# Patient Record
Sex: Male | Born: 1950 | Race: White | Hispanic: No | Marital: Married | Smoking: Current every day smoker
Health system: Southern US, Community
[De-identification: ages and names within clinical notes are randomized; demographics above are authoritative.]

## PROBLEM LIST (undated history)

## (undated) DIAGNOSIS — M1990 Unspecified osteoarthritis, unspecified site: Secondary | ICD-10-CM

## (undated) DIAGNOSIS — Z973 Presence of spectacles and contact lenses: Secondary | ICD-10-CM

## (undated) DIAGNOSIS — I2510 Atherosclerotic heart disease of native coronary artery without angina pectoris: Secondary | ICD-10-CM

## (undated) DIAGNOSIS — I10 Essential (primary) hypertension: Secondary | ICD-10-CM

## (undated) DIAGNOSIS — K219 Gastro-esophageal reflux disease without esophagitis: Secondary | ICD-10-CM

## (undated) DIAGNOSIS — F419 Anxiety disorder, unspecified: Secondary | ICD-10-CM

## (undated) DIAGNOSIS — D72829 Elevated white blood cell count, unspecified: Secondary | ICD-10-CM

## (undated) DIAGNOSIS — N2889 Other specified disorders of kidney and ureter: Secondary | ICD-10-CM

## (undated) DIAGNOSIS — J449 Chronic obstructive pulmonary disease, unspecified: Secondary | ICD-10-CM

## (undated) HISTORY — PX: HERNIA REPAIR: SHX51

## (undated) HISTORY — PX: TONSILLECTOMY: SUR1361

## (undated) HISTORY — PX: WISDOM TOOTH EXTRACTION: SHX21

---

## 1973-03-25 HISTORY — PX: FRACTURE SURGERY: SHX138

## 2004-08-16 ENCOUNTER — Encounter: Admission: RE | Admit: 2004-08-16 | Discharge: 2004-11-14 | Payer: Self-pay | Admitting: Family Medicine

## 2006-11-03 ENCOUNTER — Ambulatory Visit: Payer: Self-pay | Admitting: Hematology and Oncology

## 2006-11-07 LAB — CBC WITH DIFFERENTIAL/PLATELET
Basophils Absolute: 0.1 10*3/uL (ref 0.0–0.1)
EOS%: 1.6 % (ref 0.0–7.0)
HGB: 16.9 g/dL (ref 13.0–17.1)
LYMPH%: 21.8 % (ref 14.0–48.0)
MCH: 33.5 pg — ABNORMAL HIGH (ref 28.0–33.4)
NEUT#: 7.4 10*3/uL — ABNORMAL HIGH (ref 1.5–6.5)
NEUT%: 71.5 % (ref 40.0–75.0)
RBC: 5.04 10*6/uL (ref 4.20–5.71)
RDW: 12.8 % (ref 11.2–14.6)
WBC: 10.3 10*3/uL — ABNORMAL HIGH (ref 4.0–10.0)
lymph#: 2.3 10*3/uL (ref 0.9–3.3)

## 2006-11-11 ENCOUNTER — Ambulatory Visit (HOSPITAL_COMMUNITY): Admission: RE | Admit: 2006-11-11 | Discharge: 2006-11-11 | Payer: Self-pay | Admitting: Hematology and Oncology

## 2006-11-14 LAB — COMPREHENSIVE METABOLIC PANEL
ALT: 19 U/L (ref 0–53)
AST: 18 U/L (ref 0–37)
BUN: 15 mg/dL (ref 6–23)
Calcium: 9.3 mg/dL (ref 8.4–10.5)
Chloride: 104 mEq/L (ref 96–112)
Creatinine, Ser: 1.06 mg/dL (ref 0.40–1.50)
Potassium: 3.9 mEq/L (ref 3.5–5.3)
Sodium: 139 mEq/L (ref 135–145)
Total Bilirubin: 0.4 mg/dL (ref 0.3–1.2)
Total Protein: 6.3 g/dL (ref 6.0–8.3)

## 2006-11-14 LAB — LACTATE DEHYDROGENASE: LDH: 130 U/L (ref 94–250)

## 2009-04-03 ENCOUNTER — Encounter: Admission: RE | Admit: 2009-04-03 | Discharge: 2009-04-03 | Payer: Self-pay | Admitting: Family Medicine

## 2012-11-24 ENCOUNTER — Other Ambulatory Visit: Payer: Self-pay | Admitting: Family Medicine

## 2012-11-24 DIAGNOSIS — R109 Unspecified abdominal pain: Secondary | ICD-10-CM

## 2012-11-25 ENCOUNTER — Ambulatory Visit
Admission: RE | Admit: 2012-11-25 | Discharge: 2012-11-25 | Disposition: A | Discharge status: Home / Self Care | Payer: Federal, State, Local not specified - PPO | Source: Ambulatory Visit | Attending: Family Medicine | Admitting: Family Medicine

## 2012-11-25 DIAGNOSIS — R109 Unspecified abdominal pain: Secondary | ICD-10-CM

## 2014-06-14 ENCOUNTER — Telehealth: Payer: Self-pay | Admitting: Internal Medicine

## 2014-06-14 NOTE — Telephone Encounter (Signed)
CALLED PT ABOUT REFERRING TO [facility] DUE TO AVAILABILITY HERE IN OUR OFFICE.  INFORMED PATIENT THAT I WOULD BE CALLING [facility] AND SOMEONE WOULD CALL BACK WITH AN APPOINTMENT DATE AND TIME.

## 2014-06-15 ENCOUNTER — Encounter: Payer: Self-pay | Admitting: Hematology & Oncology

## 2014-06-22 ENCOUNTER — Telehealth: Payer: Self-pay | Admitting: Hematology & Oncology

## 2014-06-22 NOTE — Telephone Encounter (Signed)
I spoke w NEW PATIENT wife Mechele Claude) today to remind them of their appointment with Dr. Marin Olp. Also, advised them to bring all medication bottles and insurance card information.

## 2014-06-23 ENCOUNTER — Ambulatory Visit: Payer: Federal, State, Local not specified - PPO

## 2014-06-23 ENCOUNTER — Encounter: Payer: Self-pay | Admitting: Family

## 2014-06-23 ENCOUNTER — Other Ambulatory Visit: Payer: Self-pay | Admitting: Family

## 2014-06-23 ENCOUNTER — Ambulatory Visit (HOSPITAL_BASED_OUTPATIENT_CLINIC_OR_DEPARTMENT_OTHER): Payer: Federal, State, Local not specified - PPO | Admitting: Family

## 2014-06-23 ENCOUNTER — Other Ambulatory Visit (HOSPITAL_BASED_OUTPATIENT_CLINIC_OR_DEPARTMENT_OTHER): Payer: Federal, State, Local not specified - PPO

## 2014-06-23 VITALS — BP 115/74 | HR 74 | Temp 97.8°F | Resp 18 | Ht 74.0 in | Wt 226.0 lb

## 2014-06-23 DIAGNOSIS — Z720 Tobacco use: Secondary | ICD-10-CM

## 2014-06-23 DIAGNOSIS — D72829 Elevated white blood cell count, unspecified: Secondary | ICD-10-CM

## 2014-06-23 LAB — CBC WITH DIFFERENTIAL (CANCER CENTER ONLY)
BASO#: 0 10*3/uL (ref 0.0–0.2)
BASO%: 0.3 % (ref 0.0–2.0)
EOS%: 0.8 % (ref 0.0–7.0)
Eosinophils Absolute: 0.1 10*3/uL (ref 0.0–0.5)
HCT: 44.8 % (ref 38.7–49.9)
HGB: 15.2 g/dL (ref 13.0–17.1)
LYMPH#: 2 10*3/uL (ref 0.9–3.3)
LYMPH%: 13.2 % — AB (ref 14.0–48.0)
MCH: 30.6 pg (ref 28.0–33.4)
MCHC: 33.9 g/dL (ref 32.0–35.9)
MCV: 90 fL (ref 82–98)
MONO#: 0.9 10*3/uL (ref 0.1–0.9)
MONO%: 6 % (ref 0.0–13.0)
NEUT%: 79.7 % (ref 40.0–80.0)
NEUTROS ABS: 11.9 10*3/uL — AB (ref 1.5–6.5)
PLATELETS: 369 10*3/uL (ref 145–400)
RBC: 4.97 10*6/uL (ref 4.20–5.70)
RDW: 14.3 % (ref 11.1–15.7)
WBC: 14.9 10*3/uL — ABNORMAL HIGH (ref 4.0–10.0)

## 2014-06-23 LAB — CHCC SATELLITE - SMEAR

## 2014-06-23 NOTE — Progress Notes (Signed)
Hematology/Oncology Consultation   Name: Zachary Oneal      MRN: [id number]    Location: Room/bed info not found  Date: 06/23/2014 Time:1:49 PM   REFERRING PHYSICIAN: Melinda Crutch, MD  REASON FOR CONSULT: Abnormal CBC   DIAGNOSIS: Leukocytosis  HISTORY OF PRESENT ILLNESS: Zachary Oneal is a very pleasant 64 yo white male with a history of leukopenia. He was seen in 2008 by Zachary Oneal for this same issue. His Jak-2 Oneal was negative. His Zachary Oneal was also negative at that time.  His WBC count today is 14.9. He has had no issue with infections. He has smoked 1 ppd for the last 32 years. He is interested in quitting. We gave him a smoking cessation hand out and also a Zachary Oneal number.  He denies fatigue, fever, chills, n/v, cough, rash, dizziness, blurred vision, headache, chest pain, palpitations, abdominal pain, constipation, diarrhea, blood in urine or stool. He does get SOB at times with exertion.  He has no personal or familial history of anemia, bleeding or clotting disorders.  His father passed away from pancreatic cancer. His mother passed away from metastatic cancer with unknown primary.  He has 2 healthy daughters.  He is originally from [place] but moved to [place] for work 15 years ago. 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 He has experienced some stress with this new job but he says that it is getting better.  No weakness, swelling, tenderness, numbness or tingling in his extremities. No new aches or pains.  His appetite is good and he is staying hydrated. No recent weight loss or gain.   ROS: All other 10 point review of systems is negative.   PAST MEDICAL HISTORY:   No past medical history on file.  ALLERGIES: Allergies not on file    MEDICATIONS:  No current outpatient prescriptions on file prior to visit.   No current facility-administered medications on file prior to visit.     PAST SURGICAL HISTORY No past surgical history on file.  FAMILY  HISTORY: No family history on file.  SOCIAL HISTORY:  has no tobacco, alcohol, and drug history on file.  PERFORMANCE STATUS: The patient's performance status is 0 - Asymptomatic  PHYSICAL EXAM: Most Recent Vital Signs: There were no vitals taken for this visit. BP 115/74 mmHg  Pulse 74  Temp(Src) 97.8 F (36.6 C) (Oral)  Resp 18  Ht 6' 2"  (1.88 m)  Wt 226 lb (102.513 kg)  BMI 29.00 kg/m2  General Appearance:    Alert, cooperative, no distress, appears stated age  Head:    Normocephalic, without obvious abnormality, atraumatic  Eyes:    PERRL, conjunctiva/corneas clear, EOM's intact, fundi    benign, both eyes             Throat:   Lips, mucosa, and tongue normal; teeth and gums normal  Neck:   Supple, symmetrical, trachea midline, no adenopathy;       thyroid:  No enlargement/tenderness/nodules; no carotid   bruit or JVD  Back:     Symmetric, no curvature, ROM normal, no CVA tenderness  Lungs:     Clear to auscultation bilaterally, respirations unlabored  Chest wall:    No tenderness or deformity  Heart:    Regular rate and rhythm, S1 and S2 normal, no murmur, rub   or gallop  Abdomen:     Soft, non-tender, bowel sounds active all four quadrants,    no masses, no organomegaly  Extremities:   Extremities normal, atraumatic, no cyanosis or edema  Pulses:   2+ and symmetric all extremities  Skin:   Skin color, texture, turgor normal, no rashes or lesions  Lymph nodes:   Cervical, supraclavicular, and axillary nodes normal  Neurologic:   CNII-XII intact. Normal strength, sensation and reflexes      throughout   LABORATORY DATA:  No results found for this or any previous visit (from the past 48 hour(s)).    RADIOGRAPHY: No results found.     PATHOLOGY: None  ASSESSMENT/PLAN: Zachary Oneal is a very pleasant 64 yo white male with a history of leukopenia. He is asymptomatic with this. He has had no problem with infections.  He has smoked 1 ppd for the last 32  years. We discussed the fact that a long history of smoking can cause an elevated WBC count. He does want to quit. We gave him a smoking cessation packet.   His WBC count today is 14.9. Zachary Oneal viewed his blood and smear and saw no evidence of malignancy.  We do not need to see him back in the office. We gave him information for getting set up on Zachary Oneal.  All questions were answered. The patient knows to contact us with any problems, questions or concerns. We can certainly see the him for any future hematologic issues.   The patient was discussed with and also seen by Zachary Oneal and he is in agreement with the aforementioned.   [facility] [employer] M    Addendum:    I saw and examined the patient with Zachary Oneal. I looked at his blood smear. He had good maturation of his white blood cells. He had no immature myeloid cells. There were no atypical lymphocytes. I saw no blasts. His red cells and platelets appeared normal.  His white blood cell count has been somewhat elevated for the past 8 years. He  HAS ALREADY underwent an   evaluation with Zachary Oneal which was negative and the Zachary Oneal which was also negative.   There is nothing on his physical exam that would suggest  A myeloproliferative process.   I just don't think we have to get him back. Again he is smoking. It is possible at the back and use might be causing his blood count to be a little on the high side.   He was  fun talking to him.  Zachary Oneal

## 2014-06-23 NOTE — Patient Instructions (Signed)
Smoking Cessation Quitting smoking is important to your health and has many advantages. However, it is not always easy to quit since nicotine is a very addictive drug. Oftentimes, people try 3 times or more before being able to quit. This document explains the best ways for you to prepare to quit smoking. Quitting takes hard work and a lot of effort, but you can do it. ADVANTAGES OF QUITTING SMOKING  You will live longer, feel better, and live better.  Your body will feel the impact of quitting smoking almost immediately.  Within 20 minutes, blood pressure decreases. Your pulse returns to its normal level.  After 8 hours, carbon monoxide levels in the blood return to normal. Your oxygen level increases.  After 24 hours, the chance of having a heart attack starts to decrease. Your breath, hair, and body stop smelling like smoke.  After 48 hours, damaged nerve endings begin to recover. Your sense of taste and smell improve.  After 72 hours, the body is virtually free of nicotine. Your bronchial tubes relax and breathing becomes easier.  After 2 to 12 weeks, lungs can hold more air. Exercise becomes easier and circulation improves.  The risk of having a heart attack, stroke, cancer, or lung disease is greatly reduced.  After 1 year, the risk of coronary heart disease is cut in half.  After 5 years, the risk of stroke falls to the same as a nonsmoker.  After 10 years, the risk of lung cancer is cut in half and the risk of other cancers decreases significantly.  After 15 years, the risk of coronary heart disease drops, usually to the level of a nonsmoker.  If you are pregnant, quitting smoking will improve your chances of having a healthy baby.  The people you live with, especially any children, will be healthier.  You will have extra money to spend on things other than cigarettes. QUESTIONS TO THINK ABOUT BEFORE ATTEMPTING TO QUIT You may want to talk about your answers with your  health care provider.  Why do you want to quit?  If you tried to quit in the past, what helped and what did not?  What will be the most difficult situations for you after you quit? How will you plan to handle them?  Who can help you through the tough times? Your family? Friends? A health care provider?  What pleasures do you get from smoking? What ways can you still get pleasure if you quit? Here are some questions to ask your health care provider:  How can you help me to be successful at quitting?  What medicine do you think would be best for me and how should I take it?  What should I do if I need more help?  What is smoking withdrawal like? How can I get information on withdrawal? GET READY  Set a quit date.  Change your environment by getting rid of all cigarettes, ashtrays, matches, and lighters in your home, car, or work. Do not let people smoke in your home.  Review your past attempts to quit. Think about what worked and what did not. GET SUPPORT AND ENCOURAGEMENT You have a better chance of being successful if you have help. You can get support in many ways.  Tell your family, friends, and coworkers that you are going to quit and need their support. Ask them not to smoke around you.  Get individual, group, or telephone counseling and support. Programs are available at local hospitals and health centers. Call   your local health department for information about programs in your area.  Spiritual beliefs and practices may help some smokers quit.  Download a "quit meter" on your computer to keep track of quit statistics, such as how long you have gone without smoking, cigarettes not smoked, and money saved.  Get a self-help book about quitting smoking and staying off tobacco. LEARN NEW SKILLS AND BEHAVIORS  Distract yourself from urges to smoke. Talk to someone, go for a walk, or occupy your time with a task.  Change your normal routine. Take a different route to work.  Drink tea instead of coffee. Eat breakfast in a different place.  Reduce your stress. Take a hot bath, exercise, or read a book.  Plan something enjoyable to do every day. Reward yourself for not smoking.  Explore interactive web-based programs that specialize in helping you quit. GET MEDICINE AND USE IT CORRECTLY Medicines can help you stop smoking and decrease the urge to smoke. Combining medicine with the above behavioral methods and support can greatly increase your chances of successfully quitting smoking.  Nicotine replacement therapy helps deliver nicotine to your body without the negative effects and risks of smoking. Nicotine replacement therapy includes nicotine gum, lozenges, inhalers, nasal sprays, and skin patches. Some may be available over-the-counter and others require a prescription.  Antidepressant medicine helps people abstain from smoking, but how this works is unknown. This medicine is available by prescription.  Nicotinic receptor partial agonist medicine simulates the effect of nicotine in your brain. This medicine is available by prescription. Ask your health care provider for advice about which medicines to use and how to use them based on your health history. Your health care provider will tell you what side effects to look out for if you choose to be on a medicine or therapy. Carefully read the information on the package. Do not use any other product containing nicotine while using a nicotine replacement product.  RELAPSE OR DIFFICULT SITUATIONS Most relapses occur within the first 3 months after quitting. Do not be discouraged if you start smoking again. Remember, most people try several times before finally quitting. You may have symptoms of withdrawal because your body is used to nicotine. You may crave cigarettes, be irritable, feel very hungry, cough often, get headaches, or have difficulty concentrating. The withdrawal symptoms are only temporary. They are strongest  when you first quit, but they will go away within 10-14 days. To reduce the chances of relapse, try to:  Avoid drinking alcohol. Drinking lowers your chances of successfully quitting.  Reduce the amount of caffeine you consume. Once you quit smoking, the amount of caffeine in your body increases and can give you symptoms, such as a rapid heartbeat, sweating, and anxiety.  Avoid smokers because they can make you want to smoke.  Do not let weight gain distract you. Many smokers will gain weight when they quit, usually less than 10 pounds. Eat a healthy diet and stay active. You can always lose the weight gained after you quit.  Find ways to improve your mood other than smoking. FOR MORE INFORMATION  www.smokefree.gov  Document Released: 03/05/2001 Document Revised: 07/26/2013 Document Reviewed: 06/20/2011 Exit[employer] Patient Information 2015 [employer]. This information is not intended to replace advice given to you by your health care provider. Make sure you discuss any questions you have with your health care provider.  

## 2015-04-25 DIAGNOSIS — K30 Functional dyspepsia: Secondary | ICD-10-CM | POA: Insufficient documentation

## 2015-04-26 ENCOUNTER — Other Ambulatory Visit: Payer: Self-pay | Admitting: Family Medicine

## 2015-04-26 DIAGNOSIS — R1013 Epigastric pain: Secondary | ICD-10-CM

## 2015-04-26 DIAGNOSIS — R1012 Left upper quadrant pain: Secondary | ICD-10-CM

## 2015-05-05 ENCOUNTER — Ambulatory Visit
Admission: RE | Admit: 2015-05-05 | Discharge: 2015-05-05 | Disposition: A | Discharge status: Home / Self Care | Payer: Federal, State, Local not specified - PPO | Source: Ambulatory Visit | Attending: Family Medicine | Admitting: Family Medicine

## 2015-05-05 DIAGNOSIS — R1012 Left upper quadrant pain: Secondary | ICD-10-CM

## 2015-05-05 DIAGNOSIS — R1013 Epigastric pain: Secondary | ICD-10-CM

## 2015-05-22 ENCOUNTER — Other Ambulatory Visit: Payer: Self-pay | Admitting: Gastroenterology

## 2015-05-22 DIAGNOSIS — R1012 Left upper quadrant pain: Secondary | ICD-10-CM

## 2015-05-25 ENCOUNTER — Ambulatory Visit: Payer: Federal, State, Local not specified - PPO

## 2015-05-25 ENCOUNTER — Ambulatory Visit (HOSPITAL_BASED_OUTPATIENT_CLINIC_OR_DEPARTMENT_OTHER): Payer: Federal, State, Local not specified - PPO | Admitting: Hematology & Oncology

## 2015-05-25 ENCOUNTER — Other Ambulatory Visit (HOSPITAL_BASED_OUTPATIENT_CLINIC_OR_DEPARTMENT_OTHER): Payer: Federal, State, Local not specified - PPO

## 2015-05-25 ENCOUNTER — Encounter: Payer: Self-pay | Admitting: Hematology & Oncology

## 2015-05-25 VITALS — BP 123/63 | HR 70 | Temp 97.9°F | Resp 18 | Ht 74.0 in | Wt 219.0 lb

## 2015-05-25 DIAGNOSIS — D72829 Elevated white blood cell count, unspecified: Secondary | ICD-10-CM | POA: Diagnosis not present

## 2015-05-25 DIAGNOSIS — Z720 Tobacco use: Secondary | ICD-10-CM

## 2015-05-25 LAB — CBC WITH DIFFERENTIAL (CANCER CENTER ONLY)
BASO#: 0 10*3/uL (ref 0.0–0.2)
BASO%: 0.2 % (ref 0.0–2.0)
EOS ABS: 0.2 10*3/uL (ref 0.0–0.5)
EOS%: 1.2 % (ref 0.0–7.0)
HCT: 45.7 % (ref 38.7–49.9)
HEMOGLOBIN: 15.8 g/dL (ref 13.0–17.1)
LYMPH#: 2.4 10*3/uL (ref 0.9–3.3)
LYMPH%: 13 % — AB (ref 14.0–48.0)
MCH: 31.2 pg (ref 28.0–33.4)
MCHC: 34.6 g/dL (ref 32.0–35.9)
MCV: 90 fL (ref 82–98)
MONO#: 1.2 10*3/uL — ABNORMAL HIGH (ref 0.1–0.9)
MONO%: 6.6 % (ref 0.0–13.0)
NEUT%: 79 % (ref 40.0–80.0)
NEUTROS ABS: 14.3 10*3/uL — AB (ref 1.5–6.5)
Platelets: 430 10*3/uL — ABNORMAL HIGH (ref 145–400)
RBC: 5.07 10*6/uL (ref 4.20–5.70)
RDW: 15.1 % (ref 11.1–15.7)
WBC: 18.1 10*3/uL — ABNORMAL HIGH (ref 4.0–10.0)

## 2015-05-25 LAB — CHCC SATELLITE - SMEAR

## 2015-05-25 NOTE — Progress Notes (Signed)
Hematology and Oncology Follow Up Visit  Ferron Ishmael [id number] 1950/04/30 65 y.o. 05/25/2015   Principle Diagnosis:   Chronic leukocytosis  Current Therapy:    Observation     Interim History:  Mr. Hollibaugh is back for follow-up. We saw year ago. We saw him a year ago, we do not think that there was going to be a problem with the leukocytosis. It is been chronic.  His primary care doctor still is concerned about the leukocytosis. As such, he wanted Mr. Whack to be seen again.   He is now retired. 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 He did not like doing this. As such, he is just doing work around the house.  He is still smoking. He's trying to stop smoking. He is cutting back.  He's had no problem with infections. He's had no rashes. He's had a change in bowel or bladder habits.  He is having some GI issues. He has been seen by gastroenterology. He says he is due for a CT scan next week. He did have a ultrasound done about a month ago. The ultrasound of the abdomen did not show any splenomegaly. His liver was fine. Kidneys looked okay. There is no lymphadenopathy.  He's had no weight loss or weight gain. He is exercising more. He is trying to stay more "fit".  Overall, his performance status is ECOG 0.  Medications:  Current outpatient prescriptions:  .  aspirin 81 MG tablet, Take 81 mg by mouth daily., Disp: , Rfl:  .  benazepril-hydrochlorthiazide (LOTENSIN HCT) 20-25 MG per tablet, Take 1 tablet by mouth daily., Disp: , Rfl: 4 .  citalopram (CELEXA) 20 MG tablet, Take 20 mg by mouth daily., Disp: , Rfl: 1 .  famotidine-calcium carbonate-magnesium hydroxide (PEPCID COMPLETE) 10-800-165 MG chewable tablet, , Disp: , Rfl:  .  fenofibrate micronized (LOFIBRA) 134 MG capsule, Take 134 mg by mouth daily before breakfast. , Disp: , Rfl: 4 .  LORazepam (ATIVAN) 0.5 MG tablet, 1/2-1 tablet as needed for nerves, Disp: , Rfl:  .   Multiple Vitamins-Minerals (MULTIVITAMIN WITH MINERALS) tablet, Take 2 tablets by mouth daily., Disp: , Rfl:  .  Probiotic Product (PROBIOTIC & ACIDOPHILUS EX ST PO), , Disp: , Rfl:   Allergies: No Known Allergies  Past Medical History, Surgical history, Social history, and Family History were reviewed and updated.  Review of Systems: As above  Physical Exam:  height is 6' 2"  (1.88 m) and weight is 219 lb (99.338 kg). His oral temperature is 97.9 F (36.6 C). His blood pressure is 123/63 and his pulse is 70. His respiration is 18.   Wt Readings from Last 3 Encounters:  05/25/15 219 lb (99.338 kg)  06/23/14 226 lb (102.513 kg)     Well-developed and well-nourished white male in no obvious distress. Head and neck exam shows no ocular or oral lesions. There are no palpable cervical or supraclavicular lymph nodes. Lungs are clear. Cardiac exam regular rate and rhythm with no murmurs, rubs or bruits. Abdomen is soft. He has good bowel sounds. There is no fluid wave. There is no palpable liver or spleen tip. Back exam shows no tenderness over the spine, ribs or hips. Extremities shows no clubbing, cyanosis or edema. Neurological exam shows no focal neurological deficits. Skin exam shows no rashes, ecchymoses or petechia.  Lab Results  Component Value Date   WBC 18.1* 05/25/2015   HGB 15.8 05/25/2015  HCT 45.7 05/25/2015   MCV 90 05/25/2015   PLT 430* 05/25/2015     Chemistry      Component Value Date/Time   NA 139 11/07/2006 1323   K 3.9 11/07/2006 1323   CL 104 11/07/2006 1323   CO2 24 11/07/2006 1323   BUN 15 11/07/2006 1323   CREATININE 1.06 11/07/2006 1323      Component Value Date/Time   CALCIUM 9.3 11/07/2006 1323   ALKPHOS 69 11/07/2006 1323   AST 18 11/07/2006 1323   ALT 19 11/07/2006 1323   BILITOT 0.4 11/07/2006 1323         Impression and Plan: Mr. Bristol is a 65 year old male. He has chronic leukocytosis. We did look at his blood smear. He has mature white  cells. I do not see any immature myeloid cells. There are no atypical lymphocytes. Red cells show no new collated red cells. He has no rouleau formation. Platelets are adequate in number and size.  I still have to believe that the leukocytosis is reactive. I don't think he has a underlying myeloproliferative process.  As such, I don't think we have to do any bone marrow biopsies on him right now.  I think we do have to follow him along. I do that we have to get him back in 6 months. I think if all looks good 6 months, then we can probably get him back yearly.  It was nice to see him again. We will try not to bother him too much as he is very busy.  Volanda Napoleon, MD 3/2/20175:15 PM

## 2015-05-29 ENCOUNTER — Ambulatory Visit
Admission: RE | Admit: 2015-05-29 | Discharge: 2015-05-29 | Disposition: A | Discharge status: Home / Self Care | Payer: Federal, State, Local not specified - PPO | Source: Ambulatory Visit | Attending: Gastroenterology | Admitting: Gastroenterology

## 2015-05-29 DIAGNOSIS — R1012 Left upper quadrant pain: Secondary | ICD-10-CM

## 2015-05-29 MED ORDER — IOPAMIDOL (ISOVUE-300) INJECTION 61%
125.0000 mL | Freq: Once | INTRAVENOUS | Status: AC | PRN
  Administered 2015-05-29: 125 mL via INTRAVENOUS

## 2015-11-16 ENCOUNTER — Other Ambulatory Visit: Payer: Self-pay | Admitting: Gastroenterology

## 2015-11-16 DIAGNOSIS — K529 Noninfective gastroenteritis and colitis, unspecified: Secondary | ICD-10-CM

## 2015-11-29 ENCOUNTER — Ambulatory Visit
Admission: RE | Admit: 2015-11-29 | Discharge: 2015-11-29 | Disposition: A | Discharge status: Home / Self Care | Payer: Federal, State, Local not specified - PPO | Source: Ambulatory Visit | Attending: Gastroenterology | Admitting: Gastroenterology

## 2015-11-29 DIAGNOSIS — K529 Noninfective gastroenteritis and colitis, unspecified: Secondary | ICD-10-CM

## 2015-11-29 MED ORDER — IOPAMIDOL (ISOVUE-300) INJECTION 61%
125.0000 mL | Freq: Once | INTRAVENOUS | Status: AC | PRN
  Administered 2015-11-29: 125 mL via INTRAVENOUS

## 2015-11-30 ENCOUNTER — Other Ambulatory Visit (HOSPITAL_BASED_OUTPATIENT_CLINIC_OR_DEPARTMENT_OTHER): Payer: Federal, State, Local not specified - PPO

## 2015-11-30 ENCOUNTER — Encounter: Payer: Self-pay | Admitting: Hematology & Oncology

## 2015-11-30 ENCOUNTER — Ambulatory Visit (HOSPITAL_BASED_OUTPATIENT_CLINIC_OR_DEPARTMENT_OTHER): Payer: Federal, State, Local not specified - PPO | Admitting: Hematology & Oncology

## 2015-11-30 VITALS — BP 135/62 | HR 93 | Temp 97.3°F | Resp 18 | Ht 74.0 in | Wt 213.8 lb

## 2015-11-30 DIAGNOSIS — D72829 Elevated white blood cell count, unspecified: Secondary | ICD-10-CM

## 2015-11-30 LAB — CBC WITH DIFFERENTIAL (CANCER CENTER ONLY)
BASO#: 0 10*3/uL (ref 0.0–0.2)
BASO%: 0.3 % (ref 0.0–2.0)
EOS ABS: 0.3 10*3/uL (ref 0.0–0.5)
EOS%: 2 % (ref 0.0–7.0)
HCT: 45.4 % (ref 38.7–49.9)
HGB: 15.5 g/dL (ref 13.0–17.1)
LYMPH#: 2.1 10*3/uL (ref 0.9–3.3)
LYMPH%: 14.1 % (ref 14.0–48.0)
MCH: 31.7 pg (ref 28.0–33.4)
MCHC: 34.1 g/dL (ref 32.0–35.9)
MCV: 93 fL (ref 82–98)
MONO#: 1 10*3/uL — AB (ref 0.1–0.9)
MONO%: 7 % (ref 0.0–13.0)
NEUT#: 11.3 10*3/uL — ABNORMAL HIGH (ref 1.5–6.5)
NEUT%: 76.6 % (ref 40.0–80.0)
PLATELETS: 406 10*3/uL — AB (ref 145–400)
RBC: 4.89 10*6/uL (ref 4.20–5.70)
RDW: 14.7 % (ref 11.1–15.7)
WBC: 14.8 10*3/uL — ABNORMAL HIGH (ref 4.0–10.0)

## 2015-11-30 LAB — CHCC SATELLITE - SMEAR

## 2015-11-30 NOTE — Progress Notes (Signed)
Hematology and Oncology Follow Up Visit  Fitzhugh Vizcarrondo [id number] 1950-06-17 65 y.o. 11/30/2015   Principle Diagnosis:   Chronic leukocytosis  Current Therapy:    Observation     Interim History:  [name] is back for follow-up. He is doing okay. He is has been having some abdominal pain. This is over on the right side. He did have a CT of the abdomen yesterday. This is ordered by his gastroenterologist. Of course, the radiologist thought there might be an issue with the left ureteropelvic junction. Because this, I'm sure that he will have to go to urology. There was no obvious malignancy that was noted however, the urologist thought the change on left side was "worrisome" for transitional cell carcinoma.  Also noted was some probable thickening with adenopathy in the distal/terminal ileum. Again, she with this means. He may need to have a colonoscopy to look at this area.  He's not having any pain on the left side. He's having no problems urinating. There is no hematuria. There is no fever. He's had no dysuria.  His appetite has been doing well. He's had no nausea or vomiting.'s had a change in bowel or bladder habits.   He's had no weight loss or weight gain. He is exercising more. He is trying to stay more "fit".  Overall, his performance status is ECOG 0.  Medications:  Current Outpatient Prescriptions:  .  aspirin 81 MG tablet, Take 81 mg by mouth daily., Disp: , Rfl:  .  benazepril-hydrochlorthiazide (LOTENSIN HCT) 20-25 MG per tablet, Take 1 tablet by mouth daily., Disp: , Rfl: 4 .  citalopram (CELEXA) 20 MG tablet, Take 20 mg by mouth daily., Disp: , Rfl: 1 .  cyclobenzaprine (FLEXERIL) 10 MG tablet, 1/2-1 tablet, Disp: , Rfl:  .  famotidine-calcium carbonate-magnesium hydroxide (PEPCID COMPLETE) 10-800-165 MG chewable tablet, , Disp: , Rfl:  .  fenofibrate micronized (LOFIBRA) 134 MG capsule, Take 134 mg by mouth daily before breakfast. , Disp: , Rfl: 4 .  LORazepam  (ATIVAN) 0.5 MG tablet, 1/2-1 tablet as needed for nerves, Disp: , Rfl:  .  Multiple Vitamins-Minerals (MULTIVITAMIN WITH MINERALS) tablet, Take 2 tablets by mouth daily., Disp: , Rfl:  .  PENTASA 500 MG CR capsule, , Disp: , Rfl:  .  Probiotic Product (PROBIOTIC & ACIDOPHILUS EX ST PO), , Disp: , Rfl:   Allergies: No Known Allergies  Past Medical History, Surgical history, Social history, and Family History were reviewed and updated.  Review of Systems: As above  Physical Exam:  height is 6' 2"  (1.88 m) and weight is 213 lb 12.8 oz (97 kg). His oral temperature is 97.3 F (36.3 C). His blood pressure is 135/62 and his pulse is 93. His respiration is 18.   Wt Readings from Last 3 Encounters:  11/30/15 213 lb 12.8 oz (97 kg)  05/25/15 219 lb (99.3 kg)  06/23/14 226 lb (102.5 kg)     Well-developed and well-nourished white male in no obvious distress. Head and neck exam shows no ocular or oral lesions. There are no palpable cervical or supraclavicular lymph nodes. Lungs are clear. Cardiac exam regular rate and rhythm with no murmurs, rubs or bruits. Abdomen is soft. He has good bowel sounds. There is no fluid wave. There is no palpable liver or spleen tip. Back exam shows no tenderness over the spine, ribs or hips. Extremities shows no clubbing, cyanosis or edema. Neurological exam shows no focal neurological deficits. Skin exam shows no rashes, ecchymoses or  petechia.  Lab Results  Component Value Date   WBC 14.8 (H) 11/30/2015   HGB 15.5 11/30/2015   HCT 45.4 11/30/2015   MCV 93 11/30/2015   PLT 406 (H) 11/30/2015     Chemistry      Component Value Date/Time   NA 139 11/07/2006 1323   K 3.9 11/07/2006 1323   CL 104 11/07/2006 1323   CO2 24 11/07/2006 1323   BUN 15 11/07/2006 1323   CREATININE 1.06 11/07/2006 1323      Component Value Date/Time   CALCIUM 9.3 11/07/2006 1323   ALKPHOS 69 11/07/2006 1323   AST 18 11/07/2006 1323   ALT 19 11/07/2006 1323   BILITOT 0.4  11/07/2006 1323         Impression and Plan: Mr. Elpers is a 65 year old male. He has chronic leukocytosis. We did look at his blood smear. He has mature white cells. I do not see any immature myeloid cells. There are no atypical lymphocytes. Red cells show no new collated red cells. He has no rouleau formation. Platelets are adequate in number and size.  I still have to believe that the leukocytosis is reactive. I don't think he has a underlying myeloproliferative process.  I'm not sure what is going on with his CT scan. I'm not sure what this "hyper attenuation" means at the left UP junction. Of course, it sounds like he is have to go to urology now. The radiologist is worried about transitional cell carcinoma. I will defer this to his gastroenterologist who ordered the CT scan.  I will like to see him back in 3 months. Given everything that is going on, at the attic and a back in 3 months.  I would think it would be unusual for the white cell count to be elevated because of underlying malignancy. However, I cannot discount this possibility.   Cassell Smiles, MD 9/7/20178:51 AM

## 2015-12-04 ENCOUNTER — Other Ambulatory Visit: Payer: Self-pay | Admitting: Urology

## 2015-12-04 ENCOUNTER — Encounter (HOSPITAL_BASED_OUTPATIENT_CLINIC_OR_DEPARTMENT_OTHER): Payer: Self-pay | Admitting: *Deleted

## 2015-12-04 NOTE — Progress Notes (Signed)
Pt instructed npo pmn 9/12.  To [facility] 9/13 @11145 .  Lab in epic.  ? ekg

## 2015-12-06 ENCOUNTER — Encounter (HOSPITAL_BASED_OUTPATIENT_CLINIC_OR_DEPARTMENT_OTHER): Payer: Self-pay

## 2015-12-06 ENCOUNTER — Encounter (HOSPITAL_BASED_OUTPATIENT_CLINIC_OR_DEPARTMENT_OTHER)
Admission: RE | Disposition: A | Discharge status: Home / Self Care | Payer: Self-pay | Source: Ambulatory Visit | Attending: Urology

## 2015-12-06 ENCOUNTER — Other Ambulatory Visit: Payer: Self-pay

## 2015-12-06 ENCOUNTER — Ambulatory Visit (HOSPITAL_BASED_OUTPATIENT_CLINIC_OR_DEPARTMENT_OTHER): Payer: Federal, State, Local not specified - PPO | Admitting: Anesthesiology

## 2015-12-06 ENCOUNTER — Ambulatory Visit (HOSPITAL_BASED_OUTPATIENT_CLINIC_OR_DEPARTMENT_OTHER)
Admission: RE | Admit: 2015-12-06 | Discharge: 2015-12-06 | Disposition: A | Discharge status: Home / Self Care | Payer: Federal, State, Local not specified - PPO | Source: Ambulatory Visit | Attending: Urology | Admitting: Urology

## 2015-12-06 DIAGNOSIS — F17200 Nicotine dependence, unspecified, uncomplicated: Secondary | ICD-10-CM | POA: Diagnosis not present

## 2015-12-06 DIAGNOSIS — I10 Essential (primary) hypertension: Secondary | ICD-10-CM | POA: Diagnosis not present

## 2015-12-06 DIAGNOSIS — N2889 Other specified disorders of kidney and ureter: Secondary | ICD-10-CM

## 2015-12-06 DIAGNOSIS — Z7982 Long term (current) use of aspirin: Secondary | ICD-10-CM | POA: Diagnosis not present

## 2015-12-06 DIAGNOSIS — C662 Malignant neoplasm of left ureter: Secondary | ICD-10-CM | POA: Diagnosis not present

## 2015-12-06 DIAGNOSIS — Z79899 Other long term (current) drug therapy: Secondary | ICD-10-CM | POA: Insufficient documentation

## 2015-12-06 HISTORY — PX: CYSTOSCOPY WITH RETROGRADE PYELOGRAM, URETEROSCOPY AND STENT PLACEMENT: SHX5789

## 2015-12-06 HISTORY — DX: Presence of spectacles and contact lenses: Z97.3

## 2015-12-06 HISTORY — DX: Essential (primary) hypertension: I10

## 2015-12-06 HISTORY — DX: Other specified disorders of kidney and ureter: N28.89

## 2015-12-06 LAB — POCT I-STAT, CHEM 8
BUN: 22 mg/dL — ABNORMAL HIGH (ref 6–20)
Calcium, Ion: 1.24 mmol/L (ref 1.15–1.40)
Chloride: 107 mmol/L (ref 101–111)
Creatinine, Ser: 1.1 mg/dL (ref 0.61–1.24)
Glucose, Bld: 100 mg/dL — ABNORMAL HIGH (ref 65–99)
HCT: 49 % (ref 39.0–52.0)
Hemoglobin: 16.7 g/dL (ref 13.0–17.0)
Potassium: 4.5 mmol/L (ref 3.5–5.1)
Sodium: 143 mmol/L (ref 135–145)
TCO2: 23 mmol/L (ref 0–100)

## 2015-12-06 SURGERY — CYSTOURETEROSCOPY, WITH RETROGRADE PYELOGRAM AND STENT INSERTION
Anesthesia: General | Site: Renal | Laterality: Bilateral

## 2015-12-06 MED ORDER — LIDOCAINE 2% (20 MG/ML) 5 ML SYRINGE
INTRAMUSCULAR | Status: AC
  Filled 2015-12-06: qty 5

## 2015-12-06 MED ORDER — FENTANYL CITRATE (PF) 100 MCG/2ML IJ SOLN
25.0000 ug | INTRAMUSCULAR | Status: DC | PRN
  Filled 2015-12-06: qty 1

## 2015-12-06 MED ORDER — PHENAZOPYRIDINE HCL 100 MG PO TABS
ORAL_TABLET | ORAL | Status: AC
  Filled 2015-12-06: qty 2

## 2015-12-06 MED ORDER — TRAMADOL HCL 50 MG PO TABS: 50.0000 mg | ORAL_TABLET | Freq: Four times a day (QID) | ORAL | 0 refills | Status: DC | PRN

## 2015-12-06 MED ORDER — OXYCODONE HCL 5 MG/5ML PO SOLN
5.0000 mg | Freq: Once | ORAL | Status: AC | PRN
  Filled 2015-12-06: qty 5

## 2015-12-06 MED ORDER — OXYCODONE HCL 5 MG PO TABS
5.0000 mg | ORAL_TABLET | Freq: Once | ORAL | Status: AC | PRN
  Administered 2015-12-06: 5 mg via ORAL
  Filled 2015-12-06: qty 1

## 2015-12-06 MED ORDER — ACETAMINOPHEN 325 MG PO TABS
325.0000 mg | ORAL_TABLET | ORAL | Status: DC | PRN
  Filled 2015-12-06: qty 2

## 2015-12-06 MED ORDER — FENTANYL CITRATE (PF) 100 MCG/2ML IJ SOLN
INTRAMUSCULAR | Status: AC
  Filled 2015-12-06: qty 2

## 2015-12-06 MED ORDER — FENTANYL CITRATE (PF) 100 MCG/2ML IJ SOLN
INTRAMUSCULAR | Status: DC | PRN
  Administered 2015-12-06 (×4): 25 ug via INTRAVENOUS
  Administered 2015-12-06: 50 ug via INTRAVENOUS
  Administered 2015-12-06 (×6): 25 ug via INTRAVENOUS

## 2015-12-06 MED ORDER — ONDANSETRON HCL 4 MG/2ML IJ SOLN
INTRAMUSCULAR | Status: AC
  Filled 2015-12-06: qty 2

## 2015-12-06 MED ORDER — PROPOFOL 10 MG/ML IV BOLUS
INTRAVENOUS | Status: DC | PRN
  Administered 2015-12-06: 200 mg via INTRAVENOUS
  Administered 2015-12-06 (×2): 100 mg via INTRAVENOUS

## 2015-12-06 MED ORDER — BELLADONNA ALKALOIDS-OPIUM 16.2-60 MG RE SUPP
RECTAL | Status: AC
  Filled 2015-12-06: qty 1

## 2015-12-06 MED ORDER — IOHEXOL 300 MG/ML  SOLN
INTRAMUSCULAR | Status: DC | PRN
  Administered 2015-12-06: 23 mL

## 2015-12-06 MED ORDER — PROPOFOL 10 MG/ML IV BOLUS
INTRAVENOUS | Status: AC
  Filled 2015-12-06: qty 20

## 2015-12-06 MED ORDER — LACTATED RINGERS IV SOLN
INTRAVENOUS | Status: DC
  Administered 2015-12-06 (×2): via INTRAVENOUS
  Filled 2015-12-06: qty 1000

## 2015-12-06 MED ORDER — LIDOCAINE HCL 2 % EX GEL
CUTANEOUS | Status: DC | PRN
  Administered 2015-12-06: 1 via URETHRAL

## 2015-12-06 MED ORDER — ONDANSETRON HCL 4 MG/2ML IJ SOLN
INTRAMUSCULAR | Status: DC | PRN
  Administered 2015-12-06: 4 mg via INTRAVENOUS

## 2015-12-06 MED ORDER — PHENAZOPYRIDINE HCL 200 MG PO TABS
200.0000 mg | ORAL_TABLET | Freq: Once | ORAL | Status: AC
  Administered 2015-12-06: 200 mg via ORAL
  Filled 2015-12-06: qty 1

## 2015-12-06 MED ORDER — STERILE WATER FOR IRRIGATION IR SOLN
Status: DC | PRN
  Administered 2015-12-06: 3000 mL

## 2015-12-06 MED ORDER — PHENAZOPYRIDINE HCL 200 MG PO TABS: 200.0000 mg | ORAL_TABLET | Freq: Three times a day (TID) | ORAL | 0 refills | Status: DC | PRN

## 2015-12-06 MED ORDER — ACETAMINOPHEN 160 MG/5ML PO SOLN
325.0000 mg | ORAL | Status: DC | PRN
  Filled 2015-12-06: qty 20.3

## 2015-12-06 MED ORDER — SODIUM CHLORIDE 0.9 % IR SOLN
Status: DC | PRN
  Administered 2015-12-06: 4000 mL

## 2015-12-06 MED ORDER — CIPROFLOXACIN IN D5W 400 MG/200ML IV SOLN
INTRAVENOUS | Status: AC
  Filled 2015-12-06: qty 200

## 2015-12-06 MED ORDER — DEXAMETHASONE SODIUM PHOSPHATE 10 MG/ML IJ SOLN
INTRAMUSCULAR | Status: DC | PRN
  Administered 2015-12-06: 10 mg via INTRAVENOUS

## 2015-12-06 MED ORDER — CIPROFLOXACIN IN D5W 400 MG/200ML IV SOLN
400.0000 mg | INTRAVENOUS | Status: AC
  Administered 2015-12-06: 400 mg via INTRAVENOUS
  Filled 2015-12-06: qty 200

## 2015-12-06 MED ORDER — LIDOCAINE HCL (CARDIAC) 20 MG/ML IV SOLN
INTRAVENOUS | Status: DC | PRN
  Administered 2015-12-06: 100 mg via INTRAVENOUS

## 2015-12-06 MED ORDER — BELLADONNA ALKALOIDS-OPIUM 16.2-60 MG RE SUPP
RECTAL | Status: DC | PRN
  Administered 2015-12-06: 1 via RECTAL

## 2015-12-06 MED ORDER — DEXAMETHASONE SODIUM PHOSPHATE 10 MG/ML IJ SOLN
INTRAMUSCULAR | Status: AC
  Filled 2015-12-06: qty 1

## 2015-12-06 MED ORDER — TAMSULOSIN HCL 0.4 MG PO CAPS: 0.4000 mg | ORAL_CAPSULE | Freq: Every day | ORAL | 0 refills | Status: DC

## 2015-12-06 MED ORDER — OXYCODONE HCL 5 MG PO TABS
ORAL_TABLET | ORAL | Status: AC
  Filled 2015-12-06: qty 1

## 2015-12-06 SURGICAL SUPPLY — 44 items
BAG DRAIN URO-CYSTO SKYTR STRL (DRAIN) ×2 IMPLANT
BAG DRN UROCATH (DRAIN) ×1
BASKET DAKOTA 1.9FR 11X120 (BASKET) IMPLANT
BASKET LASER NITINOL 1.9FR (BASKET) IMPLANT
BASKET STNLS GEMINI 4WIRE 3FR (BASKET) IMPLANT
BASKET ZERO TIP NITINOL 2.4FR (BASKET) ×1 IMPLANT
BOSTON SCIENTIFIC  UROMAX ULTRA KIT ×1 IMPLANT
BSKT STON RTRVL 120 1.9FR (BASKET)
BSKT STON RTRVL GEM 120X11 3FR (BASKET)
BSKT STON RTRVL ZERO TP 2.4FR (BASKET) ×1
CABLE HIGH FREQUENCY MONO STRZ (ELECTRODE) ×1 IMPLANT
CATH URET 5FR 28IN OPEN ENDED (CATHETERS) ×2 IMPLANT
CATH URET DUAL LUMEN 6-10FR 50 (CATHETERS) IMPLANT
CLOTH BEACON ORANGE TIMEOUT ST (SAFETY) ×2 IMPLANT
ELECT COAG BALL END 3FR (ELECTROSURGICAL) ×2
ELECTRODE COAG BALL END 3FR (ELECTROSURGICAL) IMPLANT
FIBER LASER TRAC TIP (UROLOGICAL SUPPLIES) IMPLANT
GLOVE BIO SURGEON STRL SZ7.5 (GLOVE) ×2 IMPLANT
GLOVE BIOGEL PI IND STRL 7.0 (GLOVE) IMPLANT
GLOVE BIOGEL PI IND STRL 7.5 (GLOVE) IMPLANT
GLOVE BIOGEL PI INDICATOR 7.0 (GLOVE) ×2
GLOVE BIOGEL PI INDICATOR 7.5 (GLOVE) ×1
GOWN STRL REUS W/ TWL XL LVL3 (GOWN DISPOSABLE) ×1 IMPLANT
GOWN STRL REUS W/TWL LRG LVL3 (GOWN DISPOSABLE) ×1 IMPLANT
GOWN STRL REUS W/TWL XL LVL3 (GOWN DISPOSABLE) ×4
GUIDEWIRE 0.038 PTFE COATED (WIRE) IMPLANT
GUIDEWIRE ANG ZIPWIRE 038X150 (WIRE) IMPLANT
GUIDEWIRE STR DUAL SENSOR (WIRE) ×2 IMPLANT
IV NS 1000ML (IV SOLUTION) ×2
IV NS 1000ML BAXH (IV SOLUTION) IMPLANT
IV NS IRRIG 3000ML ARTHROMATIC (IV SOLUTION) ×3 IMPLANT
KIT BALLIN UROMAX 15FX10 (LABEL) IMPLANT
KIT BALLN UROMAX 15FX4 (MISCELLANEOUS) IMPLANT
KIT BALLN UROMAX 26 75X4 (MISCELLANEOUS)
KIT ROOM TURNOVER WOR (KITS) ×2 IMPLANT
MANIFOLD NEPTUNE II (INSTRUMENTS) ×1 IMPLANT
NS IRRIG 500ML POUR BTL (IV SOLUTION) IMPLANT
PACK CYSTO (CUSTOM PROCEDURE TRAY) ×2 IMPLANT
SET HIGH PRES BAL DIL (LABEL) ×1
SHEATH ACCESS URETERAL 38CM (SHEATH) IMPLANT
STENT URET 6FRX28 CONTOUR (STENTS) ×1 IMPLANT
TUBE CONNECTING 12X1/4 (SUCTIONS) IMPLANT
TUBE FEEDING 8FR 16IN STR KANG (MISCELLANEOUS) IMPLANT
WATER STERILE IRR 3000ML UROMA (IV SOLUTION) ×1 IMPLANT

## 2015-12-06 NOTE — Anesthesia Postprocedure Evaluation (Signed)
Anesthesia Post Note  Patient: Zachary Oneal  Procedure(s) Performed: Procedure(s) (LRB): CYSTOSCOPY WITH BILATERAL RETROGRADE PYELOGRAM, LEFT URETERAL BALLOON DILITATION, LEFT URETEROSCOPY AND LEFT URETERAL BIOPSY AND LEFT URETERAL STENT PLACEMENT (Bilateral)  Patient location during evaluation: PACU Anesthesia Type: General Level of consciousness: awake and alert Pain management: pain level controlled Vital Signs Assessment: post-procedure vital signs reviewed and stable Respiratory status: spontaneous breathing, nonlabored ventilation, respiratory function stable and patient connected to nasal cannula oxygen Cardiovascular status: blood pressure returned to baseline and stable Postop Assessment: no signs of nausea or vomiting Anesthetic complications: no    Last Vitals:  Vitals:   12/06/15 1545 12/06/15 1634  BP: (!) 145/62 (!) 157/56  Pulse: 62 60  Resp: 11 12  Temp:  36.8 C    Last Pain:  Vitals:   12/06/15 1634  TempSrc:   PainSc: 2                  Diamante Truszkowski DAVID

## 2015-12-06 NOTE — Anesthesia Procedure Notes (Signed)
Procedure Name: LMA Insertion Date/Time: 12/06/2015 1:34 PM Performed by: Justice Rocher Pre-anesthesia Checklist: Patient identified, Emergency Drugs available, Suction available and Patient being monitored Patient Re-evaluated:Patient Re-evaluated prior to inductionOxygen Delivery Method: Circle system utilized Preoxygenation: Pre-oxygenation with 100% oxygen Intubation Type: IV induction Ventilation: Mask ventilation without difficulty LMA: LMA inserted LMA Size: 4.0 Number of attempts: 1 Airway Equipment and Method: Bite block Placement Confirmation: positive ETCO2 Tube secured with: Tape Dental Injury: Teeth and Oropharynx as per pre-operative assessment

## 2015-12-06 NOTE — Op Note (Signed)
Preoperative diagnosis:  1. Left proximal ureter filling defect/ureteral mass   Postoperative diagnosis:  1. Same   Procedure: 1. Cystoscopy, bilateral retrograde pyelogram with interpretation 2. Left ureteroscopy, proximal ureteral mass resection, 1.0-1.5 centimeters in size with fulguration of the tumor base. 3. Left distal ureteral balloon dilation 4. Left ureteral stent placement  Surgeon: Ardis Hughs, MD  Anesthesia: General  Complications: None  Intraoperative findings:  #1: Right retrograde pyelogram was performed with a 5 [place] open-ended ureteral catheter and 10 mL of Omnipaque contrast. Distention and normal caliber ureter with a narrow renal pelvis, sharp calyces, no filling defects.  #2: Left retrograde pyelogram was performed in a similar fashion. The patient had a normal distal ureter, but had a large filling defect on his left retrograde pyelogram in the proximal ureter with no significant hydronephrosis proximal to this area or any additional filling defects. #3: Ureteroscopy demonstrated a 1 cm papillary lesion in the left proximal ureter which I was able to snare with a 0 tip basket. I took separate's biopsies at the base and then fulgurated the area completely. #4: I was unable to advance a flexible ureteroscope into the renal pelvis because of the narrow ureter despite dilating it at the distal aspect. However, repeat retrograde pyelogram demonstrated no additional filling defects within the left calyces and collecting system.  EBL: Minimal  Specimens: None  Indication: Zachary Oneal is a 65 y.o. patient with a filling defect seen on CT scan that was performed for epigastric pain.  After reviewing the management options for treatment, he elected to proceed with the above surgical procedure(s). We have discussed the potential benefits and risks of the procedure, side effects of the proposed treatment, the likelihood of the patient achieving the goals of the  procedure, and any potential problems that might occur during the procedure or recuperation. Informed consent has been obtained.  Description of procedure:  The patient was taken to the operating room and general anesthesia was induced.  The patient was placed in the dorsal lithotomy position, prepped and draped in the usual sterile fashion, and preoperative antibiotics were administered. A preoperative time-out was performed.   At the radial degree 30 cystoscope was sterilely passed through the patient's urethra and into the bladder under visual guidance. A 3 and 60 cystoscopic evaluation was performed with no significant abnormalities. I then performed a right retrograde pyelogram as described above. I then turned my attention the patient's left side performed a left retrograde pyelogram with the above findings. I then advanced a 0.38 sensor wire through the open-ended ureteral catheter and into the left renal pelvis. After the open-ended catheter was backed out and the cystoscope removed and was able to advance a 4/6 French semirigid ureteroscope through the patient's left ureteral orifice and navigate the ureter to the proximal aspect. I then encountered the large papillary lesions that was seen on retrograde pyelogram. Using a 0 tip basket I was able to get around the entire lesion and pull it off and removed entirely. I then reintroduced the semirigid ureteroscope and performed biopsies at the base of this area. This point, I switched my irrigation for normal saline to water. Once several biopsies had been obtained at the base of the lesion I fulgurated the area using the ureteroscopic Bugbee cautery.  I then advanced a second 0.38 sensor wire through the rigid scope and backed out the scope over the wire. I then tried to advance a single-lumen flexible ureteroscope over the wire and into the ureter  unsuccessfully. I then used a 10 cm times 15 French balloon dilator and dilated the distal ureter over  the wire. I then reintroduced the flexible ureteroscope and was able to get to the level of the lesion but unable to advance it beyond. At this point I opted to perform a retrograde pyelogram to ensure that there are no additional filling defects. The area was copiously irrigated with sterile water and aspirated so that there was no additional bleeding and known no additional debris. This point I opted to place a stent. I backed out the ureteroscope and backloaded the wire over the cystoscope and reintroduced the cystoscope into the bladder under visual guidance. I then advanced a 28 cm times 6 French double-J stent over the wire using the Seldinger technique. Using fluoroscopy and was able to position the proximal curl of the stent in the left renal pelvis. I then pulled the scope back to the bladder neck and advanced the stent until the end of the stent was visualized prior to removing the entire wire. A curl was then noted in the bladder as well. Stent was then noted to be in good position using fluoroscopic images. The bladder was subsequently emptied and the case terminated. A B&O suppository was placed in the patient's rectum and urethral lidocaine jelly injected into the patient's urethra.   Exam under anesthesia demonstrated a normal prostate.  The patient was subsequently extubated and returned to PACU in stable condition.  Ardis Hughs, M.D.

## 2015-12-06 NOTE — Anesthesia Preprocedure Evaluation (Signed)
Anesthesia Evaluation  Patient identified by MRN, date of birth, ID band Patient awake    Reviewed: Allergy & Precautions, NPO status , Patient's Chart, lab work & pertinent test results  Airway Mallampati: II  TM Distance: >3 FB Neck ROM: Full    Dental no notable dental hx.    Pulmonary neg pulmonary ROS, Current Smoker,    Pulmonary exam normal breath sounds clear to auscultation       Cardiovascular hypertension, Normal cardiovascular exam Rhythm:Regular Rate:Normal     Neuro/Psych negative neurological ROS  negative psych ROS   GI/Hepatic negative GI ROS, Neg liver ROS,   Endo/Other  negative endocrine ROS  Renal/GU Renal disease     Musculoskeletal negative musculoskeletal ROS (+)   Abdominal   Peds  Hematology negative hematology ROS (+)   Anesthesia Other Findings   Reproductive/Obstetrics negative OB ROS                             Anesthesia Physical Anesthesia Plan  ASA: II  Anesthesia Plan: General   Post-op Pain Management:    Induction: Intravenous  Airway Management Planned:   Additional Equipment:   Intra-op Plan:   Post-operative Plan: Extubation in OR  Informed Consent: I have reviewed the patients History and Physical, chart, labs and discussed the procedure including the risks, benefits and alternatives for the proposed anesthesia with the patient or authorized representative who has indicated his/her understanding and acceptance.   Dental advisory given  Plan Discussed with: CRNA  Anesthesia Plan Comments:         Anesthesia Quick Evaluation

## 2015-12-06 NOTE — Discharge Instructions (Signed)

## 2015-12-06 NOTE — H&P (Signed)
CC: Renal Mass  HPI: Zachary Oneal is a 65 year-old male patient who was referred by Dr. Anson Fret, MD who is here further eval and management of a renal mass.  The mass is on the left side.   The lesion(s) was first noted on approximately 11/29/2015. The mass was seen on CT Scan.   His symptoms include blood in urine. Patient denies having flank pain, back pain, groin pain, nausea, vomiting, fever, and chills. He has not seen blood in his urine. He does have a good appetite. He is having pain in new locations. The pain is located in the epigastric region. Patient denies right lower quadrant, left lower quadrant, right upper quadrant, left upper quadrant, and suprapubic area. He has not recently had unwanted weight loss.   He has not had previous abdominal surgery. The patient can walk a flight of steps.   The patient denies history of diabetes, heart attack or stroke. There is not a a family history of kidney cancer. There is no family history of brain tumors (AMLs), seizures or brain aneurysm's.   Patient found to have a high attenuating lesion in the proximal ureter, which was present on a CT scan in March but not obvious and not mentioned in the report.   The patient has been complaining of left upper quadrant and epigastric pain since 10 months. The pain has gotten significantly better since stopping his NSAIDs. The pain seems to be associated with food.   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 He was evaluated for this 20 years prior.     ALLERGIES: No Allergies    MEDICATIONS: Aspirin 325 mg tablet  Benazepril-Hydrochlorothiazide 20 mg-25 mg tablet  Celexa 20 mg tablet  Fenofibrate 134 mg capsule  Multi-Day Vitamins     GU PSH: None   NON-GU PSH: Colonoscopy Tonsillectomy    GU PMH: Inflammatory Disease Prostate, Unspec, Prostatitis - 2014      PMH Notes:  1898-03-25 00:00:00 - Note: Normal Routine History And Physical Adult  2009-02-20  16:29:12 - Note: Essential Hematuria  2009-01-16 15:37:06 - Note: Heartburn With Regurgitation   NON-GU PMH: Essential (primary) hypertension Pure hypercholesterolemia, unspecified    FAMILY HISTORY: Adenocarcinoma Of The Pancreas - Father Death of family member - Mother, Father Diabetes - Sister peripheral vascular disease - Father   SOCIAL HISTORY: Marital Status: Married Current Smoking Status: Patient smokes. Has smoked since 11/24/1995. Smokes 1 pack per day.  Has never drank.  Drinks 2 caffeinated drinks per day. Patient's occupation is/was retired.     Notes: Tobacco Use, Caffeine Use, Marital History - Currently Married   REVIEW OF SYSTEMS:    GU Review Male:   Patient denies frequent urination, hard to postpone urination, burning/ pain with urination, get up at night to urinate, leakage of urine, stream starts and stops, trouble starting your stream, have to strain to urinate , erection problems, and penile pain.  Gastrointestinal (Upper):   Patient denies nausea, vomiting, and indigestion/ heartburn.  Gastrointestinal (Lower):   Patient denies diarrhea and constipation.  Constitutional:   Patient denies night sweats, weight loss, fatigue, and fever.  Skin:   Patient denies skin rash/ lesion and itching.  Eyes:   Patient denies blurred vision and double vision.  Ears/ Nose/ Throat:   Patient denies sore throat and sinus problems.  Hematologic/Lymphatic:   Patient denies swollen glands and easy bruising.  Cardiovascular:   Patient denies leg swelling and chest pains.  Respiratory:  Patient denies cough and shortness of breath.  Endocrine:   Patient denies excessive thirst.  Musculoskeletal:   Patient denies back pain and joint pain.  Neurological:   Patient denies headaches and dizziness.  Psychologic:   Patient denies depression and anxiety.   VITAL SIGNS:      12/01/2015 12:04 PM  Weight 213 lb / 96.62 kg  Height 72 in / 182.88 cm  BP 124/57 mmHg  Pulse 68 /min   Temperature 97.6 F / 36 C  BMI 28.9 kg/m   MULTI-SYSTEM PHYSICAL EXAMINATION:    Constitutional: Well-nourished. No physical deformities. Normally developed. Good grooming.  Neck: Neck symmetrical, not swollen. Normal tracheal position.  Respiratory: No labored breathing, no use of accessory muscles. CTA-B  Cardiovascular: Normal temperature, normal extremity pulses, no swelling, no varicosities. RRR  Lymphatic: No enlargement of neck, axillae, groin.  Skin: No paleness, no jaundice, no cyanosis. No lesion, no ulcer, no rash.  Neurologic / Psychiatric: Oriented to time, oriented to place, oriented to person. No depression, no anxiety, no agitation.  Gastrointestinal: No mass, no tenderness, no rigidity, non obese abdomen.   Eyes: Normal conjunctivae. Normal eyelids.  Ears, Nose, Mouth, and Throat: Left ear no scars, no lesions, no masses. Right ear no scars, no lesions, no masses. Nose no scars, no lesions, no masses. Normal hearing. Normal lips.  Musculoskeletal: Normal gait and station of head and neck.     PAST DATA REVIEWED:  Source Of History:  Patient  Records Review:   Previous Doctor Records  X-Ray Review: C.T. Abdomen/Pelvis: Reviewed Films. Discussed With Patient. The patient has a small filling defect in the left proximal ureter, high attenuating lesion. This was present and a CT scan in March 2017 although subtle and not reported.    PROCEDURES:          Urinalysis - 81003 Dipstick Dipstick Cont'd  Specimen: Voided Bilirubin: Neg  Color: Yellow Ketones: Neg  Appearance: Clear Blood: Neg  Specific Gravity: 1.025 Protein: Neg  pH: 5.0 Urobilinogen: 0.2  Glucose: Neg Nitrites: Neg    Leukocyte Esterase: Neg    ASSESSMENT:      ICD-10 Details  1 GU:   Benign Neo Kidney, Unspec - D30.00   2   Ureter, left, Neoplasm of uncertain behavior - Z61.09 Left, Filling defect in the left proximal ureter. Otherwise asymptomatic.   PLAN:           Orders Labs Urine Culture  and Sensitivity          Document Letter(s):  Created for Patient: Clinical Summary         Notes:   The patient has a new finding on his most recent CT with a high attenuating lesion in the left ureter. However, this is almost certainly not associated with his epigastric pain. He appears to be asymptomatic from this filling defect. We discussed the differential for a ureteral filling defect including transitional cell carcinoma, sloughed papilla, clot, fungal ball, etc. I explained the diagnostic approach which would be done in the OR and include retrograde pyelograms and left ureteroscopy/ureteral biopsy. The risks/benefits have been discussed/explained. After having a chance to ask questions he has agreed to proceed.       The information contained in this medical record document is considered private and confidential patient information. This information can only be used for the medical diagnosis and/or medical services that are being provided by the patient's selected caregivers. This information can only be distributed outside of the patient's  care if the patient agrees and signs waivers of authorization for this information to be sent to an outside source or route.

## 2015-12-06 NOTE — Transfer of Care (Signed)
Immediate Anesthesia Transfer of Care Note  Patient: Zachary Oneal  Procedure(s) Performed: Procedure(s) (LRB): CYSTOSCOPY WITH BILATERAL RETROGRADE PYELOGRAM, LEFT URETERAL BALLOON DILITATION, LEFT URETEROSCOPY AND LEFT URETERAL BIOPSY AND LEFT URETERAL STENT PLACEMENT (Bilateral)  Patient Location: PACU  Anesthesia Type: General  Level of Consciousness: awake, sedated, patient cooperative and responds to stimulation  Airway & Oxygen Therapy: Patient Spontanous Breathing and Patient connected to face mask oxygen  Post-op Assessment: Report given to PACU RN, Post -op Vital signs reviewed and stable and Patient moving all extremities  Post vital signs: Reviewed and stable  Complications: No apparent anesthesia complications

## 2015-12-07 ENCOUNTER — Encounter (HOSPITAL_BASED_OUTPATIENT_CLINIC_OR_DEPARTMENT_OTHER): Payer: Self-pay | Admitting: Urology

## 2016-02-23 ENCOUNTER — Other Ambulatory Visit (HOSPITAL_BASED_OUTPATIENT_CLINIC_OR_DEPARTMENT_OTHER): Payer: Federal, State, Local not specified - PPO

## 2016-02-23 ENCOUNTER — Ambulatory Visit (HOSPITAL_BASED_OUTPATIENT_CLINIC_OR_DEPARTMENT_OTHER): Payer: Federal, State, Local not specified - PPO | Admitting: Hematology & Oncology

## 2016-02-23 VITALS — BP 116/63 | HR 67 | Temp 98.0°F | Resp 20 | Wt 216.0 lb

## 2016-02-23 DIAGNOSIS — D72829 Elevated white blood cell count, unspecified: Secondary | ICD-10-CM | POA: Diagnosis not present

## 2016-02-23 DIAGNOSIS — K529 Noninfective gastroenteritis and colitis, unspecified: Secondary | ICD-10-CM | POA: Diagnosis not present

## 2016-02-23 DIAGNOSIS — C679 Malignant neoplasm of bladder, unspecified: Secondary | ICD-10-CM | POA: Diagnosis not present

## 2016-02-23 DIAGNOSIS — D72823 Leukemoid reaction: Secondary | ICD-10-CM

## 2016-02-23 LAB — CBC WITH DIFFERENTIAL (CANCER CENTER ONLY)
BASO#: 0 10*3/uL (ref 0.0–0.2)
BASO%: 0.2 % (ref 0.0–2.0)
EOS ABS: 0.3 10*3/uL (ref 0.0–0.5)
EOS%: 2.4 % (ref 0.0–7.0)
HCT: 46.1 % (ref 38.7–49.9)
HGB: 15.4 g/dL (ref 13.0–17.1)
LYMPH#: 2.1 10*3/uL (ref 0.9–3.3)
LYMPH%: 15.9 % (ref 14.0–48.0)
MCH: 30.9 pg (ref 28.0–33.4)
MCHC: 33.4 g/dL (ref 32.0–35.9)
MCV: 93 fL (ref 82–98)
MONO#: 1 10*3/uL — ABNORMAL HIGH (ref 0.1–0.9)
MONO%: 7.9 % (ref 0.0–13.0)
NEUT#: 9.7 10*3/uL — ABNORMAL HIGH (ref 1.5–6.5)
NEUT%: 73.6 % (ref 40.0–80.0)
PLATELETS: 373 10*3/uL (ref 145–400)
RBC: 4.98 10*6/uL (ref 4.20–5.70)
RDW: 13.4 % (ref 11.1–15.7)
WBC: 13.2 10*3/uL — AB (ref 4.0–10.0)

## 2016-02-23 LAB — CHCC SATELLITE - SMEAR

## 2016-02-23 NOTE — Progress Notes (Signed)
Hematology and Oncology Follow Up Visit  Zachary Oneal [id number] [birth year] 65 y.o. 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 Since we last saw him, he actually was found have a low-grade malignancy in the bladder. This is at the left UP junction. He underwent a surgical procedure in September. The pathology report ([id number]) showed a low-grade papillary transitional cell carcinoma. There is no muscle wall invasion. He had a stent placed. This is been somewhat painful for him.  Otherwise, he has been doing okay. He and his family had a very nice Thanksgiving.  He has had no issues with infections. He has had no rashes. He has had no cough or shortness of breath.  He does have some colitis. He is on medication for this.  Overall, his performance status is ECOG 0.   Medications:  Current Outpatient Prescriptions:  .  aspirin 81 MG tablet, Take 81 mg by mouth daily., Disp: , Rfl:  .  benazepril-hydrochlorthiazide (LOTENSIN HCT) 20-25 MG per tablet, Take 1 tablet by mouth daily., Disp: , Rfl: 4 .  citalopram (CELEXA) 20 MG tablet, Take 20 mg by mouth daily. , Disp: , Rfl: 1 .  fenofibrate micronized (LOFIBRA) 134 MG capsule, Take 134 mg by mouth daily before breakfast. , Disp: , Rfl: 4 .  Multiple Vitamins-Minerals (MULTIVITAMIN WITH MINERALS) tablet, Take 2 tablets by mouth daily., Disp: , Rfl:  .  PENTASA 500 MG CR capsule, 1,000 mg 2 (two) times daily. , Disp: , Rfl:   Allergies: No Known Allergies  Past Medical History, Surgical history, Social history, and Family History were reviewed and updated.  Review of Systems: As above  Physical Exam:  weight is 216 lb (98 kg). His oral temperature is 98 F (36.7 C). His blood pressure is 116/63 and his pulse is 67. His respiration is 20.   Wt  Readings from Last 3 Encounters:  02/23/16 216 lb (98 kg)  12/06/15 211 lb (95.7 kg)  11/30/15 213 lb 12.8 oz (97 kg)     Well-developed and well-nourished white male in no obvious distress. Head and neck exam shows no ocular or oral lesions. There are no palpable cervical or supraclavicular lymph nodes. Lungs are clear. Cardiac exam regular rate and rhythm with no murmurs, rubs or bruits. Abdomen is soft. He has good bowel sounds. There is no fluid wave. There is no palpable liver or spleen tip. Back exam shows no tenderness over the spine, ribs or hips. Extremities shows no clubbing, cyanosis or edema. Neurological exam shows no focal neurological deficits. Skin exam shows no rashes, ecchymoses or petechia.  Lab Results  Component Value Date   WBC 13.2 (H) 02/23/2016   HGB 15.4 02/23/2016   HCT 46.1 02/23/2016   MCV 93 02/23/2016   PLT 373 02/23/2016     Chemistry      Component Value Date/Time   NA 143 12/06/2015 1244   K 4.5 12/06/2015 1244   CL 107 12/06/2015 1244   CO2 24 11/07/2006 1323   BUN 22 (H) 12/06/2015 1244   CREATININE 1.10 12/06/2015 1244      Component Value Date/Time   CALCIUM 9.3 11/07/2006 1323   ALKPHOS 69 11/07/2006 1323   AST 18 11/07/2006 1323   ALT 19 11/07/2006 1323   BILITOT 0.4 11/07/2006 1323  Impression and Plan: Zachary Oneal is a 65 year old male. He has chronic leukocytosis. We did look at his blood smear. He has mature white cells. I do not see any immature myeloid cells. There are no atypical lymphocytes. Red cells show no new collated red cells. He has no rouleau formation. Platelets are adequate in number and size.  I still have to believe that the leukocytosis is reactive. Given that his white cell count is improving, it is certainly possible that this leukemoid reaction might have been from this ureteral type malignancy.   We can probably get him back in 6 months now. I don't thematic and a back in between visits.   I know that  he isn't very good hands with the urologist for this low-grade transitional cell carcinoma. It sounds like he will have another surgical procedure in the winter or  early spring   Volanda Napoleon, MD 12/1/20173:07 PM

## 2016-02-29 ENCOUNTER — Other Ambulatory Visit: Payer: Federal, State, Local not specified - PPO

## 2016-02-29 ENCOUNTER — Ambulatory Visit: Payer: Federal, State, Local not specified - PPO | Admitting: Hematology & Oncology

## 2016-03-20 ENCOUNTER — Ambulatory Visit: Payer: Self-pay | Admitting: Surgery

## 2016-03-20 DIAGNOSIS — Z720 Tobacco use: Secondary | ICD-10-CM

## 2016-03-20 DIAGNOSIS — K4020 Bilateral inguinal hernia, without obstruction or gangrene, not specified as recurrent: Secondary | ICD-10-CM | POA: Insufficient documentation

## 2016-03-20 DIAGNOSIS — I10 Essential (primary) hypertension: Secondary | ICD-10-CM | POA: Insufficient documentation

## 2016-03-20 DIAGNOSIS — F17210 Nicotine dependence, cigarettes, uncomplicated: Secondary | ICD-10-CM | POA: Insufficient documentation

## 2016-03-20 DIAGNOSIS — F411 Generalized anxiety disorder: Secondary | ICD-10-CM | POA: Insufficient documentation

## 2016-03-20 NOTE — H&P (Signed)
Zachary Oneal 03/20/2016 2:48 PM Location: [facility] Patient #: [id number] DOB: Dec 09, 1950 Married / Language: English / Race: White Male  Patient Care Team: Lawerance Cruel, MD as PCP - General (Family Medicine) Michael Boston, MD as Consulting Physician (General Surgery) Ardis Hughs, MD as Attending Physician (Urology) Wonda Horner, MD as Consulting Physician (Gastroenterology)   History of Present Illness Zachary Hector MD; 03/20/2016 7:16 PM) The patient is a 65 year old male who presents with an inguinal hernia. Note for "Inguinal hernia": Patient sent for surgical consultation is requested of his primary care physician, Dr. Harrington Oneal. Concern for symptomatic left inguinal hernia  Pleasant active smoking male. Promises he is gaining down to half pack per day but has not completely quit. Had some left abdominal pain. CT scan done. Left inguinal hernia noted. Patient also had left-sided hydronephrosis. Underwent cystoscopy. Polypoid mass found in distal ureter. Excised and fulgurated. 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 Due for follow-up cystoscopy next week. However patient noted worsening left groin pain. Stop primary care physician. Concern for inguinal hernia. Surgical consultation requested.  Patient usually move his bowels about three times a day. No bad bouts of constipation or diarrhea. Seen by Dr Penelope Coop with [facility]. Some question of possible regional enteritis on abdominal MRI. Stable on Pentasa. No Crohn's flares or fistulas or abscesses. Walks at least half hour without difficulty.   Past Surgical History Zachary Oneal, [place]; 03/20/2016 2:48 PM) No pertinent past surgical history  Diagnostic Studies History Zachary Oneal, [place]; 03/20/2016 2:48 PM) Colonoscopy within last year  Allergies Zachary Oneal, [place]; 03/20/2016 2:48 PM) No Known Drug Allergies 03/20/2016  Medication History Zachary Oneal,  [place]; 03/20/2016 2:49 PM) TraMADol HCl (50MG Tablet, Oral as needed) Active. Benazepril-Hydrochlorothiazide (20-25MG Tablet, Oral daily) Active. Citalopram Hydrobromide (20MG Tablet, Oral daily) Active. Fenofibrate Micronized (134MG Capsule, Oral daily) Active. HydrOXYzine HCl (10MG Tablet, Oral daily) Active. Nitroglycerin (0.2MG/HR Patch 24HR, Transdermal daily) Active. Pentasa (500MG Capsule ER, Oral daily) Active. Medications Reconciled Aspirin (325MG Tablet, Oral as needed) Active.  Social History Zachary Oneal, [place]; 03/20/2016 2:48 PM) Alcohol use Remotely quit alcohol use. Caffeine use Coffee. No drug use Tobacco use Current some day smoker.  Family History Zachary Oneal, [place]; 03/20/2016 2:48 PM) Cancer Mother. Diabetes Mellitus Sister. Hypertension Father. Malignant Neoplasm Of Pancreas Father.  Other Problems Zachary Oneal, [place]; 03/20/2016 2:48 PM) Cancer Crohn's Disease High blood pressure Hypercholesterolemia Other disease, cancer, significant illness     Review of Systems Zachary Oneal CMA; 03/20/2016 2:48 PM) General Not Present- Appetite Loss, Chills, Fatigue, Fever, Night Sweats, Weight Gain and Weight Loss. Skin Not Present- Change in Wart/Mole, Dryness, Hives, Jaundice, New Lesions, Non-Healing Wounds, Rash and Ulcer. HEENT Not Present- Earache, Hearing Loss, Hoarseness, Nose Bleed, Oral Ulcers, Ringing in the Ears, Seasonal Allergies, Sinus Pain, Sore Throat, Visual Disturbances, Wears glasses/contact lenses and Yellow Eyes. Respiratory Not Present- Bloody sputum, Chronic Cough, Difficulty Breathing, Snoring and Wheezing. Breast Not Present- Breast Mass, Breast Pain, Nipple Discharge and Skin Changes. Cardiovascular Not Present- Chest Pain, Difficulty Breathing Lying Down, Leg Cramps, Palpitations, Rapid Heart Rate, Shortness of Breath and Swelling of Extremities. Gastrointestinal Present- Abdominal Pain. Not Present- Bloating, Bloody  Stool, Change in Bowel Habits, Chronic diarrhea, Constipation, Difficulty Swallowing, Excessive gas, Gets full quickly at meals, Hemorrhoids, Indigestion, Nausea, Rectal Pain and Vomiting. Male Genitourinary Present- Frequency. Not Present- Blood in Urine, Change in Urinary Stream, Impotence, Nocturia, Painful Urination, Urgency and Urine Leakage. Musculoskeletal Present- Muscle Pain. Not  Present- Back Pain, Joint Pain, Joint Stiffness, Muscle Weakness and Swelling of Extremities. Neurological Not Present- Decreased Memory, Fainting, Headaches, Numbness, Seizures, Tingling, Tremor, Trouble walking and Weakness. Psychiatric Not Present- Anxiety, Bipolar, Change in Sleep Pattern, Depression, Fearful and Frequent crying. Endocrine Not Present- Cold Intolerance, Excessive Hunger, Hair Changes, Heat Intolerance, Hot flashes and New Diabetes. Hematology Not Present- Blood Thinners, Easy Bruising, Excessive bleeding, Gland problems, HIV and Persistent Infections.  Vitals Bary Castilla [name]; 03/20/2016 2:50 PM) 03/20/2016 2:49 PM Weight: 213.2 lb Height: 74in Body Surface Area: 2.23 m Body Mass Index: 27.37 kg/m  Temp.: 36F  Pulse: 60 (Regular)  BP: 118/62 (Sitting, Left Arm, Standard)      Physical Exam Zachary Hector MD; 03/20/2016 7:17 PM)  General Mental Status-Alert. General Appearance-Not in acute distress, Not Sickly. Orientation-Oriented X3. Hydration-Well hydrated. Voice-Normal. Note: Calm, smiling  Integumentary Global Assessment Upon inspection and palpation of skin surfaces of the - Axillae: non-tender, no inflammation or ulceration, no drainage. and Distribution of scalp and body hair is normal. General Characteristics Temperature - normal warmth is noted.  Head and Neck Head-normocephalic, atraumatic with no lesions or palpable masses. Face Global Assessment - atraumatic, no absence of expression. Neck Global Assessment - no abnormal  movements, no bruit auscultated on the right, no bruit auscultated on the left, no decreased range of motion, non-tender. Trachea-midline. Thyroid Gland Characteristics - non-tender.  Eye Eyeball - Left-Extraocular movements intact, No Nystagmus. Eyeball - Right-Extraocular movements intact, No Nystagmus. Cornea - Left-No Hazy. Cornea - Right-No Hazy. Sclera/Conjunctiva - Left-No scleral icterus, No Discharge. Sclera/Conjunctiva - Right-No scleral icterus, No Discharge. Pupil - Left-Direct reaction to light normal. Pupil - Right-Direct reaction to light normal.  ENMT Ears Pinna - Left - no drainage observed, no generalized tenderness observed. Right - no drainage observed, no generalized tenderness observed. Nose and Sinuses External Inspection of the Nose - no destructive lesion observed. Inspection of the nares - Left - quiet respiration. Right - quiet respiration. Mouth and Throat Lips - Upper Lip - no fissures observed, no pallor noted. Lower Lip - no fissures observed, no pallor noted. Nasopharynx - no discharge present. Oral Cavity/Oropharynx - Tongue - no dryness observed. Oral Mucosa - no cyanosis observed. Hypopharynx - no evidence of airway distress observed.  Chest and Lung Exam Inspection Movements - Normal and Symmetrical. Accessory muscles - No use of accessory muscles in breathing. Palpation Palpation of the chest reveals - Non-tender. Auscultation Breath sounds - Normal and Clear.  Cardiovascular Auscultation Rhythm - Regular. Murmurs & Other Heart Sounds - Auscultation of the heart reveals - No Murmurs and No Systolic Clicks.  Abdomen Inspection Inspection of the abdomen reveals - No Visible peristalsis and No Abnormal pulsations. Umbilicus - No Bleeding, No Urine drainage. Palpation/Percussion Palpation and Percussion of the abdomen reveal - Soft, Non Tender, No Rebound tenderness, No Rigidity (guarding) and No Cutaneous  hyperesthesia. Note: Abdomen soft. Nontender, nondistended. No guarding. No diastasis. No umbilical nor other hernias  Male Genitourinary Sexual Maturity Tanner 5 - Adult hair pattern and Adult penile size and shape. Note: Left groin bulging consistent with moderate size inguinal hernia. Sensitive a reducible. Obvious right inguinal hernia on Valsalva cough. Normal external genitalia. Epididymi, testes, and spermatic cords normal without any masses.  Peripheral Vascular Upper Extremity Inspection - Left - No Cyanotic nailbeds, Not Ischemic. Right - No Cyanotic nailbeds, Not Ischemic.  Neurologic Neurologic evaluation reveals -normal attention span and ability to concentrate, able to name objects and repeat phrases. Appropriate fund  of knowledge , normal sensation and normal coordination. Mental Status Affect - not angry, not paranoid. Cranial Nerves-Normal Bilaterally. Gait-Normal.  Neuropsychiatric Mental status exam performed with findings of-able to articulate well with normal speech/language, rate, volume and coherence, thought content normal with ability to perform basic computations and apply abstract reasoning and no evidence of hallucinations, delusions, obsessions or homicidal/suicidal ideation.  Musculoskeletal Global Assessment Spine, Ribs and Pelvis - no instability, subluxation or laxity. Right Upper Extremity - no instability, subluxation or laxity.  Lymphatic Head & Neck  General Head & Neck Lymphatics: Bilateral - Description - No Localized lymphadenopathy. Axillary  General Axillary Region: Bilateral - Description - No Localized lymphadenopathy. Femoral & Inguinal  Generalized Femoral & Inguinal Lymphatics: Left - Description - No Localized lymphadenopathy. Right - Description - No Localized lymphadenopathy.    Assessment & Plan Zachary Hector MD; 03/20/2016 7:18 PM)  BILATERAL INGUINAL HERNIA WITHOUT OBSTRUCTION OR GANGRENE, RECURRENCE NOT  SPECIFIED (K40.20) Impression: Bilateral inguinal hernias, left greater than right.  I think he would benefit from surgery. He is interested in proceeding. Laparoscopic approach with mesh.  He is due to follow up with his urologist for re-evaluation of his ureter since he had a early urothelial cancer causing ureteral obstruction that was removed cystoscopically. He noted if that is clear and stable that he would proceed with hernia surgery. If further workup needs to happen, he would like to continue with the ureteral cancer first. that seems reasonable. He has no concerning signs of worsening pain, incarceration or strangulation. No debilitating pain.  PREOP - ING HERNIA - ENCOUNTER FOR PREOPERATIVE EXAMINATION FOR GENERAL SURGICAL PROCEDURE (Z01.818)  Current Plans You are being scheduled for surgery- Our schedulers will call you.  You should hear from our office's scheduling department within 5 working days about the location, date, and time of surgery. We try to make accommodations for patient's preferences in scheduling surgery, but sometimes the OR schedule or the surgeon's schedule prevents [place] from making those accommodations.  If you have not heard from our office [phone]) in 5 working days, call the office and ask for your surgeon's nurse.  If you have other questions about your diagnosis, plan, or surgery, call the office and ask for your surgeon's nurse.  Written instructions provided The anatomy & physiology of the abdominal wall and pelvic floor was discussed. The pathophysiology of hernias in the inguinal and pelvic region was discussed. Natural history risks such as progressive enlargement, pain, incarceration, and strangulation was discussed. Contributors to complications such as smoking, obesity, diabetes, prior surgery, etc were discussed.  I feel the risks of no intervention will lead to serious problems that outweigh the operative risks; therefore, I  recommended surgery to reduce and repair the hernia. I explained laparoscopic techniques with possible need for an open approach. I noted usual use of mesh to patch and/or buttress hernia repair  Risks such as bleeding, infection, abscess, need for further treatment, heart attack, death, and other risks were discussed. I noted a good likelihood this will help address the problem. Goals of post-operative recovery were discussed as well. Possibility that this will not correct all symptoms was explained. I stressed the importance of low-impact activity, aggressive pain control, avoiding constipation, & not pushing through pain to minimize risk of post-operative chronic pain or injury. Possibility of reherniation was discussed. We will work to minimize complications.  An educational handout further explaining the pathology & treatment options was given as well. Questions were answered. The patient expresses understanding &  wishes to proceed with surgery.  Pt Education - Pamphlet Given - Laparoscopic Hernia Repair: discussed with patient and provided information. Pt Education - [facility] Pain Control (Xzavion Doswell) Pt Education - [facility] Hernia Post-Op HCI (Demara Lover): discussed with patient and provided information. TOBACCO ABUSE (Z72.0) Impression: I strongly recommend he quit smoking to avoid complications of surgery including infection and hernia recurrence.  Current Plans Pt Education - [facility] STOP SMOKING!  STOP SMOKING! We talked to the patient about the dangers of smoking.  We stressed that tobacco use dramatically increases the risk of peri-operative complications such as infection, tissue necrosis leaving to problems with incision/wound and organ healing, hernia, chronic pain, heart attack, stroke, DVT, pulmonary embolism, and death.  We noted there are programs in our community to help stop smoking.  Information was available.

## 2016-03-29 ENCOUNTER — Other Ambulatory Visit: Payer: Self-pay | Admitting: Urology

## 2016-04-03 ENCOUNTER — Other Ambulatory Visit: Payer: Self-pay | Admitting: Gastroenterology

## 2016-04-03 DIAGNOSIS — K529 Noninfective gastroenteritis and colitis, unspecified: Secondary | ICD-10-CM

## 2016-04-09 ENCOUNTER — Ambulatory Visit
Admission: RE | Admit: 2016-04-09 | Discharge: 2016-04-09 | Disposition: A | Discharge status: Home / Self Care | Payer: Federal, State, Local not specified - PPO | Source: Ambulatory Visit | Attending: Gastroenterology | Admitting: Gastroenterology

## 2016-04-09 DIAGNOSIS — K529 Noninfective gastroenteritis and colitis, unspecified: Secondary | ICD-10-CM

## 2016-04-09 MED ORDER — IOPAMIDOL (ISOVUE-300) INJECTION 61%
125.0000 mL | Freq: Once | INTRAVENOUS | Status: AC | PRN
  Administered 2016-04-09: 125 mL via INTRAVENOUS

## 2016-08-23 ENCOUNTER — Ambulatory Visit (HOSPITAL_BASED_OUTPATIENT_CLINIC_OR_DEPARTMENT_OTHER): Payer: Federal, State, Local not specified - PPO | Admitting: Hematology & Oncology

## 2016-08-23 ENCOUNTER — Other Ambulatory Visit (HOSPITAL_BASED_OUTPATIENT_CLINIC_OR_DEPARTMENT_OTHER): Payer: Federal, State, Local not specified - PPO

## 2016-08-23 VITALS — BP 117/60 | HR 65 | Temp 98.0°F | Resp 18 | Wt 208.8 lb

## 2016-08-23 DIAGNOSIS — D72829 Elevated white blood cell count, unspecified: Secondary | ICD-10-CM

## 2016-08-23 DIAGNOSIS — D72823 Leukemoid reaction: Secondary | ICD-10-CM

## 2016-08-23 LAB — CBC WITH DIFFERENTIAL (CANCER CENTER ONLY)
BASO#: 0 10*3/uL (ref 0.0–0.2)
BASO%: 0.3 % (ref 0.0–2.0)
EOS%: 2.1 % (ref 0.0–7.0)
Eosinophils Absolute: 0.3 10*3/uL (ref 0.0–0.5)
HEMATOCRIT: 46.4 % (ref 38.7–49.9)
HEMOGLOBIN: 15.6 g/dL (ref 13.0–17.1)
LYMPH#: 1.9 10*3/uL (ref 0.9–3.3)
LYMPH%: 12.3 % — ABNORMAL LOW (ref 14.0–48.0)
MCH: 31.1 pg (ref 28.0–33.4)
MCHC: 33.6 g/dL (ref 32.0–35.9)
MCV: 92 fL (ref 82–98)
MONO#: 1 10*3/uL — ABNORMAL HIGH (ref 0.1–0.9)
MONO%: 6.4 % (ref 0.0–13.0)
NEUT%: 78.9 % (ref 40.0–80.0)
NEUTROS ABS: 12.4 10*3/uL — AB (ref 1.5–6.5)
Platelets: 399 10*3/uL (ref 145–400)
RBC: 5.02 10*6/uL (ref 4.20–5.70)
RDW: 14.1 % (ref 11.1–15.7)
WBC: 15.7 10*3/uL — ABNORMAL HIGH (ref 4.0–10.0)

## 2016-08-23 LAB — CHCC SATELLITE - SMEAR

## 2016-08-23 NOTE — Progress Notes (Signed)
Hematology and Oncology Follow Up Visit  Zachary Oneal [id number] Jul 15, 1950 66 y.o. 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 He is doing quite well. He had inguinal hernia repair back in January I think.  He sees the urologist later on this summer. He has another cystoscopy.  He's had no problems with nausea or vomiting. He's had no fever. He's had no rashes. He's had no cough or shortness of breath.   He is still smoking. He's trying to cut back.   His appetite is doing quite well.  Overall, his performance status is ECOG 0.   Medications:  Current Outpatient Prescriptions:  .  aspirin 81 MG tablet, Take 81 mg by mouth daily., Disp: , Rfl:  .  benazepril-hydrochlorthiazide (LOTENSIN HCT) 20-25 MG per tablet, Take 1 tablet by mouth daily., Disp: , Rfl: 4 .  citalopram (CELEXA) 20 MG tablet, Take 20 mg by mouth daily. , Disp: , Rfl: 1 .  fenofibrate micronized (LOFIBRA) 134 MG capsule, Take 134 mg by mouth daily before breakfast. , Disp: , Rfl: 4 .  Multiple Vitamins-Minerals (MULTIVITAMIN WITH MINERALS) tablet, Take 2 tablets by mouth daily., Disp: , Rfl:  .  PENTASA 500 MG CR capsule, 1,000 mg 2 (two) times daily. , Disp: , Rfl:   Allergies: No Known Allergies  Past Medical History, Surgical history, Social history, and Family History were reviewed and updated.  Review of Systems: As above  Physical Exam:  weight is 208 lb 12 oz (94.7 kg). His oral temperature is 98 F (36.7 C). His blood pressure is 117/60 and his pulse is 65. His respiration is 18 and oxygen saturation is 95%.   Wt Readings from Last 3 Encounters:  08/23/16 208 lb 12 oz (94.7 kg)  02/23/16 216 lb (98 kg)  12/06/15 211 lb (95.7 kg)     Well-developed and well-nourished white male in no obvious distress. Head and  neck exam shows no ocular or oral lesions. There are no palpable cervical or supraclavicular lymph nodes. Lungs are clear. Cardiac exam regular rate and rhythm with no murmurs, rubs or bruits. Abdomen is soft. He has good bowel sounds. There is no fluid wave. There is no palpable liver or spleen tip. Back exam shows no tenderness over the spine, ribs or hips. Extremities shows no clubbing, cyanosis or edema. Neurological exam shows no focal neurological deficits. Skin exam shows no rashes, ecchymoses or petechia.  Lab Results  Component Value Date   WBC 15.7 (H) 08/23/2016   HGB 15.6 08/23/2016   HCT 46.4 08/23/2016   MCV 92 08/23/2016   PLT 399 08/23/2016     Chemistry      Component Value Date/Time   NA 143 12/06/2015 1244   K 4.5 12/06/2015 1244   CL 107 12/06/2015 1244   CO2 24 11/07/2006 1323   BUN 22 (H) 12/06/2015 1244   CREATININE 1.10 12/06/2015 1244      Component Value Date/Time   CALCIUM 9.3 11/07/2006 1323   ALKPHOS 69 11/07/2006 1323   AST 18 11/07/2006 1323   ALT 19 11/07/2006 1323   BILITOT 0.4 11/07/2006 1323         Impression and Plan: Zachary Oneal is a 66 year old male. He has chronic leukocytosis.   His blood smear still looks okay. The white blood cells  appear reactive. I do not see any immature myeloid cells.  I think the fact that he is smoking is a factor area and he is trying to cut back.  I believe we can get him back in 6 months.  He is followed by urology very closely.  It is possible that the fact that he had inguinal hernia repair earlier this year could still be an issue with the slight increase in leukocytosis.  As always, it was a lot of fun talking with him.  Volanda Napoleon, MD 6/1/20181:34 PM

## 2017-02-28 ENCOUNTER — Ambulatory Visit: Payer: Federal, State, Local not specified - PPO | Admitting: Hematology & Oncology

## 2017-02-28 ENCOUNTER — Other Ambulatory Visit: Payer: Federal, State, Local not specified - PPO

## 2017-03-13 ENCOUNTER — Other Ambulatory Visit (HOSPITAL_BASED_OUTPATIENT_CLINIC_OR_DEPARTMENT_OTHER): Payer: Federal, State, Local not specified - PPO

## 2017-03-13 ENCOUNTER — Other Ambulatory Visit: Payer: Self-pay

## 2017-03-13 ENCOUNTER — Encounter: Payer: Self-pay | Admitting: Hematology & Oncology

## 2017-03-13 ENCOUNTER — Ambulatory Visit (HOSPITAL_BASED_OUTPATIENT_CLINIC_OR_DEPARTMENT_OTHER): Payer: Federal, State, Local not specified - PPO | Admitting: Hematology & Oncology

## 2017-03-13 VITALS — BP 121/61 | HR 80 | Temp 97.4°F | Resp 18 | Wt 200.0 lb

## 2017-03-13 DIAGNOSIS — D72829 Elevated white blood cell count, unspecified: Secondary | ICD-10-CM | POA: Diagnosis not present

## 2017-03-13 DIAGNOSIS — D72823 Leukemoid reaction: Secondary | ICD-10-CM

## 2017-03-13 DIAGNOSIS — Z720 Tobacco use: Secondary | ICD-10-CM | POA: Diagnosis not present

## 2017-03-13 LAB — CBC WITH DIFFERENTIAL (CANCER CENTER ONLY)
BASO#: 0 10*3/uL (ref 0.0–0.2)
BASO%: 0.3 % (ref 0.0–2.0)
EOS ABS: 0 10*3/uL (ref 0.0–0.5)
EOS%: 0.3 % (ref 0.0–7.0)
HCT: 47.6 % (ref 38.7–49.9)
HGB: 16.3 g/dL (ref 13.0–17.1)
LYMPH#: 1.3 10*3/uL (ref 0.9–3.3)
LYMPH%: 8.8 % — AB (ref 14.0–48.0)
MCH: 30.8 pg (ref 28.0–33.4)
MCHC: 34.2 g/dL (ref 32.0–35.9)
MCV: 90 fL (ref 82–98)
MONO#: 0.8 10*3/uL (ref 0.1–0.9)
MONO%: 5.4 % (ref 0.0–13.0)
NEUT%: 85.2 % — ABNORMAL HIGH (ref 40.0–80.0)
NEUTROS ABS: 12.7 10*3/uL — AB (ref 1.5–6.5)
PLATELETS: 431 10*3/uL — AB (ref 145–400)
RBC: 5.3 10*6/uL (ref 4.20–5.70)
RDW: 13.6 % (ref 11.1–15.7)
WBC: 14.8 10*3/uL — AB (ref 4.0–10.0)

## 2017-03-13 LAB — CHCC SATELLITE - SMEAR

## 2017-03-13 NOTE — Progress Notes (Signed)
Hematology and Oncology Follow Up Visit  Zachary Oneal [id number] 1950/10/17 66 y.o. 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  He is doing fairly well.  He had no problems over the summer time.  Unfortunately, he stopped his Celexa.  He, to no surprise, felt the effects of this.  He had a relapse of his depression.  He was pretty much incapacitated.  He started retaking the Celexa.  He now is feeling better.  He has had no problems with his bladder.  He will have a cystoscopy in early January.  He has not noted any hematuria.  He has had no fever.  He did have the "flu" right after Thanksgiving.  It seemed to go around his house.  A daughter of his is moving down to [place].  He is going to have to move her down there.  He has had no bleeding.  He has had no change in bowel or bladder habits.  He has had no nausea or vomiting.  He has had d no cough.  He has had id no shortness of breath.  He is still smoking.  He is trying to cut back.    Overall, his performance status is ECOG 0.   Medications:  Current Outpatient Medications:  .  aspirin 81 MG tablet, Take 81 mg by mouth daily., Disp: , Rfl:  .  benazepril-hydrochlorthiazide (LOTENSIN HCT) 20-25 MG per tablet, Take 1 tablet by mouth daily., Disp: , Rfl: 4 .  citalopram (CELEXA) 20 MG tablet, Take 20 mg by mouth daily. , Disp: , Rfl: 1 .  doxycycline (VIBRA-TABS) 100 MG tablet, , Disp: , Rfl:  .  fenofibrate micronized (LOFIBRA) 134 MG capsule, Take 134 mg by mouth daily before breakfast. , Disp: , Rfl: 4 .  hydrOXYzine (ATARAX/VISTARIL) 10 MG tablet, 1 TABLET THREE TIMES A DAY AS NEEDED ORALLY 30, Disp: , Rfl: 0 .  Multiple Vitamins-Minerals (MULTIVITAMIN WITH MINERALS) tablet, Take 2 tablets by mouth daily., Disp: , Rfl:  .  PENTASA 500 MG CR  capsule, 1,000 mg 2 (two) times daily. , Disp: , Rfl:  .  traZODone (DESYREL) 50 MG tablet, TAKE 1/2 TO 2 TABLETS AT BEDTIME AS NEEDED ONCE A DAY, Disp: , Rfl: 0  Allergies:  Allergies  Allergen Reactions  . Other Other (See Comments)    Past Medical History, Surgical history, Social history, and Family History were reviewed and updated.  Review of Systems: Review of Systems  All other systems reviewed and are negative.  Physical Exam:  weight is 200 lb (90.7 kg). His oral temperature is 97.4 F (36.3 C) (abnormal). His blood pressure is 121/61 and his pulse is 80. His respiration is 18 and oxygen saturation is 94%.   Wt Readings from Last 3 Encounters:  03/13/17 200 lb (90.7 kg)  08/23/16 208 lb 12 oz (94.7 kg)  02/23/16 216 lb (98 kg)     Physical Exam  Constitutional: He is oriented to person, place, and time.  HENT:  Head: Normocephalic and atraumatic.  Mouth/Throat: Oropharynx is clear and moist.  Eyes: EOM are normal. Pupils are equal, round, and reactive to light.  Neck: Normal range of motion.  Cardiovascular: Normal rate, regular rhythm and normal heart sounds.  Pulmonary/Chest: Effort normal and breath sounds normal.  Abdominal: Soft. Bowel sounds  are normal.  Musculoskeletal: Normal range of motion. He exhibits no edema, tenderness or deformity.  Lymphadenopathy:    He has no cervical adenopathy.  Neurological: He is alert and oriented to person, place, and time.  Skin: Skin is warm and dry. No rash noted. No erythema.  Psychiatric: He has a normal mood and affect. His behavior is normal. Judgment and thought content normal.  Vitals reviewed.    Lab Results  Component Value Date   WBC 14.8 (H) 03/13/2017   HGB 16.3 03/13/2017   HCT 47.6 03/13/2017   MCV 90 03/13/2017   PLT 431 (H) 03/13/2017     Chemistry      Component Value Date/Time   NA 143 12/06/2015 1244   K 4.5 12/06/2015 1244   CL 107 12/06/2015 1244   CO2 24 11/07/2006 1323   BUN 22 (H)  12/06/2015 1244   CREATININE 1.10 12/06/2015 1244      Component Value Date/Time   CALCIUM 9.3 11/07/2006 1323   ALKPHOS 69 11/07/2006 1323   AST 18 11/07/2006 1323   ALT 19 11/07/2006 1323   BILITOT 0.4 11/07/2006 1323         Impression and Plan: Zachary Oneal is a 66 year old male. He has chronic leukocytosis.   His blood smear still looks okay. The white blood cells appear reactive. I do not see any immature myeloid cells.  I think that as long as he is smoking, he probably will have some degree of leukocytosis.  We will plan to get him back in 6 months.  As always, he is a lot of fun to talk to.    Volanda Napoleon, MD 12/20/20182:09 PM

## 2017-04-04 DIAGNOSIS — Z125 Encounter for screening for malignant neoplasm of prostate: Secondary | ICD-10-CM | POA: Diagnosis not present

## 2017-04-04 DIAGNOSIS — E782 Mixed hyperlipidemia: Secondary | ICD-10-CM | POA: Diagnosis not present

## 2017-04-04 DIAGNOSIS — R05 Cough: Secondary | ICD-10-CM | POA: Diagnosis not present

## 2017-04-04 DIAGNOSIS — Z0000 Encounter for general adult medical examination without abnormal findings: Secondary | ICD-10-CM | POA: Diagnosis not present

## 2017-04-04 DIAGNOSIS — K5000 Crohn's disease of small intestine without complications: Secondary | ICD-10-CM | POA: Diagnosis not present

## 2017-04-04 DIAGNOSIS — F324 Major depressive disorder, single episode, in partial remission: Secondary | ICD-10-CM | POA: Diagnosis not present

## 2017-04-04 DIAGNOSIS — F411 Generalized anxiety disorder: Secondary | ICD-10-CM | POA: Diagnosis not present

## 2017-04-04 DIAGNOSIS — I10 Essential (primary) hypertension: Secondary | ICD-10-CM | POA: Diagnosis not present

## 2017-04-04 DIAGNOSIS — Z23 Encounter for immunization: Secondary | ICD-10-CM | POA: Diagnosis not present

## 2017-04-04 DIAGNOSIS — Z1159 Encounter for screening for other viral diseases: Secondary | ICD-10-CM | POA: Diagnosis not present

## 2017-04-05 ENCOUNTER — Other Ambulatory Visit: Payer: Self-pay | Admitting: Family Medicine

## 2017-04-05 DIAGNOSIS — Z0000 Encounter for general adult medical examination without abnormal findings: Secondary | ICD-10-CM

## 2017-04-09 ENCOUNTER — Other Ambulatory Visit: Payer: Self-pay | Admitting: Urology

## 2017-04-09 DIAGNOSIS — N135 Crossing vessel and stricture of ureter without hydronephrosis: Secondary | ICD-10-CM | POA: Diagnosis not present

## 2017-04-09 DIAGNOSIS — C652 Malignant neoplasm of left renal pelvis: Secondary | ICD-10-CM | POA: Diagnosis not present

## 2017-04-09 DIAGNOSIS — R82998 Other abnormal findings in urine: Secondary | ICD-10-CM | POA: Diagnosis not present

## 2017-04-14 DIAGNOSIS — R300 Dysuria: Secondary | ICD-10-CM | POA: Diagnosis not present

## 2017-04-14 DIAGNOSIS — D4122 Neoplasm of uncertain behavior of left ureter: Secondary | ICD-10-CM | POA: Diagnosis not present

## 2017-04-23 ENCOUNTER — Ambulatory Visit
Admission: RE | Admit: 2017-04-23 | Discharge: 2017-04-23 | Disposition: A | Discharge status: Home / Self Care | Payer: Federal, State, Local not specified - PPO | Source: Ambulatory Visit | Attending: Family Medicine | Admitting: Family Medicine

## 2017-04-23 DIAGNOSIS — Z0000 Encounter for general adult medical examination without abnormal findings: Secondary | ICD-10-CM

## 2017-04-23 DIAGNOSIS — Z136 Encounter for screening for cardiovascular disorders: Secondary | ICD-10-CM | POA: Diagnosis not present

## 2017-04-23 DIAGNOSIS — Z87891 Personal history of nicotine dependence: Secondary | ICD-10-CM | POA: Diagnosis not present

## 2017-05-16 DIAGNOSIS — G4700 Insomnia, unspecified: Secondary | ICD-10-CM | POA: Diagnosis not present

## 2017-05-16 DIAGNOSIS — F324 Major depressive disorder, single episode, in partial remission: Secondary | ICD-10-CM | POA: Diagnosis not present

## 2017-05-20 ENCOUNTER — Ambulatory Visit (INDEPENDENT_AMBULATORY_CARE_PROVIDER_SITE_OTHER)
Admission: RE | Admit: 2017-05-20 | Discharge: 2017-05-20 | Disposition: A | Discharge status: Home / Self Care | Payer: Medicare Other | Source: Ambulatory Visit | Attending: Internal Medicine | Admitting: Internal Medicine

## 2017-05-20 ENCOUNTER — Encounter: Payer: Self-pay | Admitting: Internal Medicine

## 2017-05-20 ENCOUNTER — Ambulatory Visit (INDEPENDENT_AMBULATORY_CARE_PROVIDER_SITE_OTHER): Payer: Medicare Other | Admitting: Internal Medicine

## 2017-05-20 VITALS — BP 120/60 | HR 70 | Ht 73.0 in | Wt 198.2 lb

## 2017-05-20 DIAGNOSIS — I10 Essential (primary) hypertension: Secondary | ICD-10-CM

## 2017-05-20 DIAGNOSIS — R05 Cough: Secondary | ICD-10-CM

## 2017-05-20 DIAGNOSIS — F17210 Nicotine dependence, cigarettes, uncomplicated: Secondary | ICD-10-CM

## 2017-05-20 DIAGNOSIS — J449 Chronic obstructive pulmonary disease, unspecified: Secondary | ICD-10-CM

## 2017-05-20 DIAGNOSIS — R0602 Shortness of breath: Secondary | ICD-10-CM | POA: Diagnosis not present

## 2017-05-20 DIAGNOSIS — R058 Other specified cough: Secondary | ICD-10-CM | POA: Insufficient documentation

## 2017-05-20 MED ORDER — LOSARTAN POTASSIUM-HCTZ 100-25 MG PO TABS: 1.0000 | ORAL_TABLET | Freq: Every day | ORAL | 2 refills | Status: DC

## 2017-05-20 NOTE — Progress Notes (Signed)
Subjective:     Patient ID: Zachary Oneal, male   DOB: Aug 09, 1950,    MRN: [id number]  HPI  21 yowm active smoker  from [place] with new variable  cough x 2016 referred to pulmonary clinic 05/20/2017 by Dr   Melinda Crutch    05/20/2017 1st [facility] office visit/ Zachary Oneal   Chief Complaint  Patient presents with  . Pulmonary Consult    Referred by Dr. Harrington Challenger.  [name]  His cough is non prod.     cough is worse with cold air/ non productive not typically worse in am's or winter vs summer (cold a/c will trigger the same cough) Doe = MMRC1 = can walk nl pace, flat grade, can't hurry or go uphills or steps s sob   Never noct cough   No obvious other patterns in  day to day or daytime variability or assoc excess/ purulent sputum or mucus plugs or hemoptysis or cp or chest tightness, subjective wheeze or overt sinus or hb symptoms. No unusual exposure hx or h/o childhood pna/ asthma or knowledge of premature birth.  Sleeping ok flat without nocturnal  or early am exacerbation  of respiratory  c/o's or need for noct saba. Also denies any obvious fluctuation of symptoms with weather or environmental changes or other aggravating or alleviating factors except as outlined above   Current Allergies, Complete Past Medical History, Past Surgical History, Family History, and Social History were reviewed in [employer] record.  ROS  The following are not active complaints unless bolded Hoarseness, sore throat, dysphagia, dental problems, itching, sneezing,  nasal congestion or discharge of excess mucus or purulent secretions, ear ache,   fever, chills, sweats, unintended wt loss or wt gain, classically pleuritic or exertional cp,  orthopnea pnd or leg swelling, presyncope, palpitations, abdominal pain, anorexia, nausea, vomiting, diarrhea  or change in bowel habits or change in bladder habits, change in stools or change in urine, dysuria, hematuria,   rash, arthralgias, visual complaints, headache, numbness, weakness or ataxia or problems with walking or coordination,  change in mood/affect or memory.        Current Meds  Medication Sig  . citalopram (CELEXA) 20 MG tablet Take 20 mg by mouth daily.   Marland Kitchen doxycycline (VIBRA-TABS) 100 MG tablet   . fenofibrate micronized (LOFIBRA) 134 MG capsule Take 134 mg by mouth daily before breakfast.   . mirtazapine (REMERON) 15 MG tablet Take 15 mg by mouth at bedtime.  Marland Kitchen PENTASA 500 MG CR capsule 1,000 mg 2 (two) times daily.   .   benazepril-hydrochlorthiazide (LOTENSIN HCT) 20-25 MG per tablet Take 1 tablet by mouth daily.               Review of Systems     Objective:   Physical Exam  Very animated fast talking amb wm nad    barrel chest pos hoover early insp/ somewhat smoker's cough   Wt Readings from Last 3 Encounters:  05/20/17 198 lb 3.2 oz (89.9 kg)  03/13/17 200 lb (90.7 kg)  08/23/16 208 lb 12 oz (94.7 kg)     Vital signs reviewed - Note on arrival 02 sats  96% on RA      HEENT: nl dentition, turbinates bilaterally, and oropharynx. Nl external ear canals without cough reflex   NECK :  without JVD/Nodes/TM/ nl carotid upstrokes bilaterally   LUNGS: no acc muscle use,   Markedly barrel shaped /  hyper resonant to percussion with slt diminished bs bilaterally but no cough on insp or exp    CV:  RRR  no s3 or murmur or increase in P2, and no edema   ABD:  soft and nontender with pos hoover's mid inspiration supine position. No bruits or organomegaly appreciated, bowel sounds nl  MS:  Nl gait/ ext warm without deformities, calf tenderness, cyanosis or clubbing No obvious joint restrictions   SKIN: warm and dry without lesions    NEURO:  alert, approp, nl sensorium with  no motor or cerebellar deficits apparent.          CXR PA and Lateral:   05/20/2017 :    I personally reviewed images and agree with radiology impression as follows:   Hyperaeration may  indicate emphysema.  No pneumonia or effusion.    Assessment:

## 2017-05-20 NOTE — Patient Instructions (Addendum)
The key is to stop smoking completely before smoking completely stops you!   Stop lotensin and start losartan 100-21m one daily in its place - call me if problems with blood pressure after this change    Please remember to go to the  x-ray department downstairs in the basement  for your tests - we will call you with the results when they are available.      Please schedule a follow up office visit in 6 weeks, call sooner if needed with pfts on return

## 2017-05-20 NOTE — Assessment & Plan Note (Signed)
In the best review of chronic cough to date ( NEJM 2016 375 [phone]) ,  ACEi are now felt to cause cough in up to  20% of pts which is a 4 fold increase from previous reports and does not include the variety of non-specific complaints we see in pulmonary clinic in pts on ACEi but previously attributed to another dx like  Copd/asthma and  include PNDS, throat and chest congestion, "bronchitis", unexplained dyspnea and noct "strangling" sensations, and hoarseness, but also  atypical /refractory GERD symptoms like dysphagia and "bad heartburn"   The only way I know  to prove this is not an "ACEi Case" is a trial off ACEi x a minimum of 6 weeks then regroup.    Try losartan 100-25 one daily then regroup in 6 weeks and self monitor in meantime

## 2017-05-20 NOTE — Assessment & Plan Note (Addendum)
>   3 min discussion  I took an extended  opportunity with this patient to outline the consequences of continued cigarette use  in airway disorders based on all the data we have from the multiple national lung health studies (perfomed over decades at millions of dollars in cost)  indicating that smoking cessation, not choice of inhalers or physicians, is the most important aspect of care.   

## 2017-05-20 NOTE — Assessment & Plan Note (Addendum)
Clinically he just has mild or at most moderate copd without really limiting doe or tendency to aecopd with a cough that is not typical of copd but rather favors uacs ? From ACEi   rec  Try off acei and return in 6 weeks for pfts / risk stratification depending on severiyt Work on stop smoking in meantime

## 2017-05-20 NOTE — Assessment & Plan Note (Signed)
Upper airway cough syndrome (previously labeled PNDS),  is so named because it's frequently impossible to sort out how much is  CR/sinusitis with freq throat clearing (which can be related to primary GERD)   vs  causing  secondary (" extra esophageal")  GERD from wide swings in gastric pressure that occur with throat clearing, often  promoting self use of mint and menthol lozenges that reduce the lower esophageal sphincter tone and exacerbate the problem further in a cyclical fashion.   These are the same pts (now being labeled as having "irritable larynx syndrome" by some cough centers) who not infrequently have a history of having failed to tolerate ace inhibitors,  dry powder inhalers or biphosphonates or report having atypical/extraesophageal reflux symptoms that don't respond to standard doses of PPI  and are easily confused as having aecopd or asthma flares by even experienced allergists/ pulmonologists (myself included).   Try off acei first then regroup in 6 weeks

## 2017-05-21 ENCOUNTER — Encounter: Payer: Self-pay | Admitting: Internal Medicine

## 2017-05-21 NOTE — Progress Notes (Signed)
Was able to talk to the patient regarding their results.  They verbalized an understanding of what was discussed. No further questions at this time. 

## 2017-06-02 ENCOUNTER — Telehealth: Payer: Self-pay | Admitting: Internal Medicine

## 2017-06-02 MED ORDER — BISOPROLOL FUMARATE 5 MG PO TABS: 5.0000 mg | ORAL_TABLET | Freq: Every day | ORAL | 0 refills | Status: DC

## 2017-06-02 NOTE — Telephone Encounter (Signed)
Very unlikely cause and effect but sorry to hear that Would try different class altogether and try bisoprolol 5 mg daily on lopressor 50 mg bid depending on insurance

## 2017-06-02 NOTE — Telephone Encounter (Signed)
Patient aware nothing further needed.

## 2017-06-02 NOTE — Telephone Encounter (Signed)
Pt is calling back [phone]

## 2017-06-02 NOTE — Telephone Encounter (Signed)
Called and spoke with patient, he states that he was seen by MW last week and was given a new BP medication. After taking a few doses of this medication he started having kidney pain. Patient states that he went back to taking his old BP medication and the symptoms subsided. Patient wanted to advise MW of this.

## 2017-06-02 NOTE — Telephone Encounter (Signed)
Called patient unable to reach left message to give us a call back.

## 2017-06-02 NOTE — Telephone Encounter (Signed)
Left message for patient to call back  

## 2017-06-13 ENCOUNTER — Other Ambulatory Visit: Payer: Self-pay | Admitting: Internal Medicine

## 2017-06-13 MED ORDER — LOSARTAN POTASSIUM-HCTZ 100-25 MG PO TABS: 1.0000 | ORAL_TABLET | Freq: Every day | ORAL | 0 refills | Status: DC

## 2017-07-01 ENCOUNTER — Ambulatory Visit (INDEPENDENT_AMBULATORY_CARE_PROVIDER_SITE_OTHER): Payer: Medicare Other | Admitting: Internal Medicine

## 2017-07-01 ENCOUNTER — Encounter: Payer: Self-pay | Admitting: Internal Medicine

## 2017-07-01 DIAGNOSIS — R05 Cough: Secondary | ICD-10-CM | POA: Diagnosis not present

## 2017-07-01 DIAGNOSIS — F17210 Nicotine dependence, cigarettes, uncomplicated: Secondary | ICD-10-CM | POA: Diagnosis not present

## 2017-07-01 DIAGNOSIS — J449 Chronic obstructive pulmonary disease, unspecified: Secondary | ICD-10-CM | POA: Diagnosis not present

## 2017-07-01 DIAGNOSIS — I10 Essential (primary) hypertension: Secondary | ICD-10-CM | POA: Diagnosis not present

## 2017-07-01 DIAGNOSIS — R058 Other specified cough: Secondary | ICD-10-CM

## 2017-07-01 LAB — PULMONARY FUNCTION TEST
DL/VA % PRED: 61 %
DL/VA: 2.85 ml/min/mmHg/L
DLCO UNC % PRED: 56 %
DLCO UNC: 19.16 ml/min/mmHg
FEF 25-75 POST: 1.56 L/s
FEF 25-75 Pre: 1.13 L/sec
FEF2575-%Change-Post: 38 %
FEF2575-%PRED-POST: 55 %
FEF2575-%PRED-PRE: 40 %
FEV1-%Change-Post: 12 %
FEV1-%Pred-Post: 67 %
FEV1-%Pred-Pre: 59 %
FEV1-Post: 2.4 L
FEV1-Pre: 2.13 L
FEV1FVC-%Change-Post: 1 %
FEV1FVC-%PRED-PRE: 80 %
FEV6-%Change-Post: 11 %
FEV6-%PRED-POST: 85 %
FEV6-%Pred-Pre: 76 %
FEV6-POST: 3.85 L
FEV6-Pre: 3.46 L
FEV6FVC-%CHANGE-POST: 0 %
FEV6FVC-%PRED-POST: 103 %
FEV6FVC-%Pred-Pre: 103 %
FVC-%Change-Post: 11 %
FVC-%Pred-Post: 81 %
FVC-%Pred-Pre: 73 %
FVC-PRE: 3.51 L
FVC-Post: 3.9 L
POST FEV1/FVC RATIO: 62 %
PRE FEV1/FVC RATIO: 61 %
Post FEV6/FVC ratio: 99 %
Pre FEV6/FVC Ratio: 98 %
RV % pred: 166 %
RV: 4.02 L
TLC % PRED: 114 %
TLC: 8.23 L

## 2017-07-01 MED ORDER — MOMETASONE FURO-FORMOTEROL FUM 100-5 MCG/ACT IN AERO: 2.0000 | INHALATION_SPRAY | Freq: Two times a day (BID) | RESPIRATORY_TRACT | 0 refills | Status: DC

## 2017-07-01 MED ORDER — MOMETASONE FURO-FORMOTEROL FUM 100-5 MCG/ACT IN AERO: 2.0000 | INHALATION_SPRAY | Freq: Two times a day (BID) | RESPIRATORY_TRACT | 11 refills | Status: DC

## 2017-07-01 NOTE — Progress Notes (Signed)
Subjective:     Patient ID: Zachary Oneal, male   DOB: [birth year],    MRN: [id number]    Brief patient profile:  53 yowm active smoker  from [place] with new variable  cough x 2016 referred to pulmonary clinic 05/20/2017 by Dr   Zachary Oneal     History of Present Illness  05/20/2017 1st [facility] office visit/ Zachary Oneal   Chief Complaint  Patient presents with  . Pulmonary Consult    Referred by Dr. Harrington Oneal.  [name]  His cough is non prod.     cough is worse with cold air/ non productive not typically worse in am's or winter vs summer (cold a/c will trigger the same cough) Doe = MMRC1 = can walk nl pace, flat grade, can't hurry or go uphills or steps s sob   Never noct cough  rec The key is to stop smoking completely before smoking completely stops you!  Stop lotensin and start losartan 100-88m one daily in its place - call me if problems with blood pressure after this change  Please remember to go to the  x-ray department downstairs in the basement  for your tests - we will call you with the results when they are available.  Please schedule a follow up office visit in 6 weeks, call sooner if needed with pfts on return      07/01/2017  f/u ov/Zachary Oneal re:  GOLD II/  Cough had improved now worse x sev weeks - thinks its the weather/ on zebeta instead of losartan "caused kidney pain" >>resolved off it  Chief Complaint  Patient presents with  . Follow-up    PFT's done today. Cough has been worse since the last visit- non prod. Breathing is unchanged.     Dyspnea:  Not limited by breathing from desired activities   Cough: more daytime than noct, nonprod Sleep: fine  SABA use:  None   No obvious day to day or daytime variability or assoc excess/ purulent sputum or mucus plugs or hemoptysis or cp or chest tightness, subjective wheeze or overt sinus or hb symptoms. No unusual exposure hx or h/o childhood pna/ asthma or knowledge of premature  birth.  Sleeping  Fine flat   without nocturnal  or early am exacerbation  of respiratory  c/o's or need for noct saba. Also denies any obvious fluctuation of symptoms with weather or environmental changes or other aggravating or alleviating factors except as outlined above   Current Allergies, Complete Past Medical History, Past Surgical History, Family History, and Social History [facility]  ROS  The following are not active complaints unless bolded Hoarseness, sore throat, dysphagia, dental problems, itching, sneezing,  nasal congestion or discharge of excess mucus or purulent secretions, ear ache,   fever, chills, sweats, unintended wt loss or wt gain, classically pleuritic or exertional cp,  orthopnea pnd or arm/hand swelling  or leg swelling, presyncope, palpitations, abdominal pain, anorexia, nausea, vomiting, diarrhea  or change in bowel habits or change in bladder habits, change in stools or change in urine, dysuria, hematuria,  rash, arthralgias, visual complaints, headache, numbness, weakness or ataxia or problems with walking or coordination,  change in mood or  memory.        Current Meds  Medication Sig  . bisoprolol (ZEBETA) 5 MG tablet Take 1 tablet (5 mg total) by mouth daily.  . citalopram (CELEXA) 20 MG tablet Take  20 mg by mouth daily.   Marland Kitchen doxycycline (VIBRA-TABS) 100 MG tablet   . fenofibrate micronized (LOFIBRA) 134 MG capsule Take 134 mg by mouth daily before breakfast.   . mirtazapine (REMERON) 15 MG tablet Take 15 mg by mouth at bedtime.  Marland Kitchen PENTASA 500 MG CR capsule 1,000 mg 2 (two) times daily.   . [DISCONTINUED] losartan-hydrochlorothiazide (HYZAAR) 100-25 MG tablet Take 1 tablet by mouth daily.                    Objective:   Physical Exam  amb wm nad   07/01/2017          209   05/20/17 198 lb 3.2 oz (89.9 kg)  03/13/17 200 lb (90.7 kg)  08/23/16 208 lb 12 oz (94.7 kg)     Vital signs reviewed - Note  on arrival 02 sats  96% on RA  And BP 128/66 and pulse 55 p am zebeta 5 mg        HEENT: nl dentition / oropharynx. Nl external ear canals without cough reflex - mild to moderate bilateral non-specific turbinate edema     NECK :  without JVD/Nodes/TM/ nl carotid upstrokes bilaterally   LUNGS: no acc muscle use,  Mod barrel  contour chest wall with bilateral  Distant bs s audible wheeze and  without cough on insp or exp maneuver and mild/mod   Hyperresonant  to  percussion bilaterally     CV:  RRR  no s3 or murmur or increase in P2, and no edema   ABD:  soft and nontender with pos mid insp Hoover's  in the supine position. No bruits or organomegaly appreciated, bowel sounds nl  MS:   Nl gait/  ext warm without deformities, calf tenderness, cyanosis or clubbing No obvious joint restrictions   SKIN: warm and dry without lesions    NEURO:  alert, approp, nl sensorium with  no motor or cerebellar deficits apparent.                Assessment:

## 2017-07-01 NOTE — Patient Instructions (Addendum)
Dulera 100 up to 2 pffs every 12 hours if you feel it helps your cough or breath   The key is to stop smoking completely before smoking completely stops you - it's not too late as you only have GOLD II severity of COPD at this point    If you are satisfied with your treatment plan,  let your doctor know and he/she can either refill your medications or you can return here when your prescription runs out.     If in any way you are not 100% satisfied,  please tell us.  If 100% better, tell your friends!  Pulmonary follow up is as needed

## 2017-07-01 NOTE — Progress Notes (Signed)
PFT completed 07/01/17

## 2017-07-02 ENCOUNTER — Encounter: Payer: Self-pay | Admitting: Internal Medicine

## 2017-07-02 NOTE — Assessment & Plan Note (Addendum)
>   3 min discussion I reviewed the Fletcher curve with the patient that basically indicates  if you quit smoking when your best day FEV1 is still   preserved (as is clearly still relatively true  here)  it is highly unlikely you will progress to severe disease and informed the patient there was  no medication on the market that has proven to alter the curve/ its downward trajectory  or the likelihood of progression of their disease(unlike other chronic medical conditions such as atheroclerosis where we do think we can change the natural hx with risk reducing meds)    Therefore stopping smoking and maintaining abstinence are  the most important aspects of care, not choice of inhalers or for that matter, doctors.   Treatment other than smoking cessation  is entirely directed by severity of symptoms and focused also on reducing exacerbations, not attempting to change the natural history of the disease.

## 2017-07-02 NOTE — Assessment & Plan Note (Signed)
PFT's  07/01/2017  FEV1 2.40 (67 % ) ratio 62  p 12 % improvement from saba p nothing prior to study with DLCO  56 % corrects to 61 % for alv volume - 07/01/2017  After extensive coaching inhaler device  effectiveness =    75% > take dulera 100 2bid prn symptoms    He had may have a component of asthma but his COPD is only moderate at  this point and mainly needs to be addressed by stopping smoking rather than adding medications. In this setting he can certainly use Dulera 100  if he feels it helps any of his symptoms.   I had an extended discussion with the patient reviewing all relevant studies completed to date and  lasting 15 to 20 minutes of a 25 minute visit    Each maintenance medication was reviewed in detail including most importantly the difference between maintenance and prns and under what circumstances the prns are to be triggered using an action plan format that is not reflected in the computer generated alphabetically organized AVS.    Please see AVS for specific instructions unique to this visit that I personally wrote and verbalized to the the pt in detail and then reviewed with pt  by my nurse highlighting any  changes in therapy recommended at today's visit to their plan of care.

## 2017-07-02 NOTE — Assessment & Plan Note (Addendum)
Try off acei 05/20/2017 due to cough > improved off acei   Adequate control on present rx, reviewed in detail with pt > no change in rx needed    Although even in retrospect it may not be clear the ACEi contributed to the pt's symptoms,  Pt improved off them and adding them back at this point or in the future would risk confusion in interpretation of non-specific respiratory symptoms to which this patient is prone  ie  Better not to muddy the waters here.   Would continue bisoprolol 5 mg 1 tablet daily and follow-up with his primary care physician for all refills.

## 2017-08-13 DIAGNOSIS — K5000 Crohn's disease of small intestine without complications: Secondary | ICD-10-CM | POA: Diagnosis not present

## 2017-08-14 ENCOUNTER — Other Ambulatory Visit: Payer: Self-pay | Admitting: Internal Medicine

## 2017-08-14 ENCOUNTER — Other Ambulatory Visit: Payer: Self-pay | Admitting: Gastroenterology

## 2017-08-14 DIAGNOSIS — K50019 Crohn's disease of small intestine with unspecified complications: Secondary | ICD-10-CM

## 2017-08-14 DIAGNOSIS — C689 Malignant neoplasm of urinary organ, unspecified: Secondary | ICD-10-CM

## 2017-08-28 ENCOUNTER — Ambulatory Visit
Admission: RE | Admit: 2017-08-28 | Discharge: 2017-08-28 | Disposition: A | Discharge status: Home / Self Care | Payer: Federal, State, Local not specified - PPO | Source: Ambulatory Visit | Attending: Gastroenterology | Admitting: Gastroenterology

## 2017-08-28 DIAGNOSIS — C689 Malignant neoplasm of urinary organ, unspecified: Secondary | ICD-10-CM

## 2017-08-28 DIAGNOSIS — K50019 Crohn's disease of small intestine with unspecified complications: Secondary | ICD-10-CM

## 2017-08-28 DIAGNOSIS — K5090 Crohn's disease, unspecified, without complications: Secondary | ICD-10-CM | POA: Diagnosis not present

## 2017-08-28 MED ORDER — IOPAMIDOL (ISOVUE-300) INJECTION 61%
125.0000 mL | Freq: Once | INTRAVENOUS | Status: AC | PRN
  Administered 2017-08-28: 125 mL via INTRAVENOUS

## 2017-09-12 ENCOUNTER — Inpatient Hospital Stay: Payer: Medicare Other | Attending: Hematology & Oncology | Admitting: Hematology & Oncology

## 2017-09-12 ENCOUNTER — Inpatient Hospital Stay: Payer: Medicare Other

## 2017-09-12 ENCOUNTER — Other Ambulatory Visit: Payer: Self-pay

## 2017-09-12 VITALS — BP 136/59 | HR 66 | Temp 97.6°F | Resp 18 | Wt 199.0 lb

## 2017-09-12 DIAGNOSIS — D72823 Leukemoid reaction: Secondary | ICD-10-CM

## 2017-09-12 DIAGNOSIS — K5090 Crohn's disease, unspecified, without complications: Secondary | ICD-10-CM

## 2017-09-12 DIAGNOSIS — D72829 Elevated white blood cell count, unspecified: Secondary | ICD-10-CM | POA: Diagnosis not present

## 2017-09-12 DIAGNOSIS — C679 Malignant neoplasm of bladder, unspecified: Secondary | ICD-10-CM | POA: Diagnosis not present

## 2017-09-12 DIAGNOSIS — Z720 Tobacco use: Secondary | ICD-10-CM | POA: Insufficient documentation

## 2017-09-12 LAB — CBC WITH DIFFERENTIAL (CANCER CENTER ONLY)
BASOS ABS: 0 10*3/uL (ref 0.0–0.1)
Basophils Relative: 0 %
EOS ABS: 0.2 10*3/uL (ref 0.0–0.5)
EOS PCT: 1 %
HCT: 49.4 % (ref 38.7–49.9)
Hemoglobin: 16.7 g/dL (ref 13.0–17.1)
LYMPHS PCT: 13 %
Lymphs Abs: 1.7 10*3/uL (ref 0.9–3.3)
MCH: 30.5 pg (ref 28.0–33.4)
MCHC: 33.8 g/dL (ref 32.0–35.9)
MCV: 90.3 fL (ref 82.0–98.0)
Monocytes Absolute: 0.8 10*3/uL (ref 0.1–0.9)
Monocytes Relative: 6 %
Neutro Abs: 10.1 10*3/uL — ABNORMAL HIGH (ref 1.5–6.5)
Neutrophils Relative %: 80 %
PLATELETS: 328 10*3/uL (ref 145–400)
RBC: 5.47 MIL/uL (ref 4.20–5.70)
RDW: 14.5 % (ref 11.1–15.7)
WBC: 12.9 10*3/uL — AB (ref 4.0–10.0)

## 2017-09-12 LAB — CMP (CANCER CENTER ONLY)
ALT: 13 U/L (ref 0–55)
AST: 21 U/L (ref 5–34)
Albumin: 4 g/dL (ref 3.5–5.0)
Alkaline Phosphatase: 66 U/L (ref 40–150)
Anion gap: 8 (ref 3–11)
BUN: 16 mg/dL (ref 7–26)
CHLORIDE: 104 mmol/L (ref 98–109)
CO2: 26 mmol/L (ref 22–29)
CREATININE: 1.2 mg/dL (ref 0.70–1.30)
Calcium: 10.1 mg/dL (ref 8.4–10.4)
GFR, Est AFR Am: >60 mL/min (ref >60)
GFR, Estimated: >60 mL/min (ref >60)
Glucose, Bld: 95 mg/dL (ref 70–140)
Potassium: 4.6 mmol/L (ref 3.5–5.1)
SODIUM: 138 mmol/L (ref 136–145)
Total Bilirubin: 0.3 mg/dL (ref 0.2–1.2)
Total Protein: 7.4 g/dL (ref 6.4–8.3)

## 2017-09-12 NOTE — Progress Notes (Signed)
Hematology and Oncology Follow Up Visit  Son Barkan [id number] September 28, 1950 67 y.o. 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 Biello is back for follow-up.  He is doing fairly well.  He now has Crohn's disease.  Apparently this is found a few months ago.  He was having some abdominal issues.  He had a CT scan done.  He was never scoped.  He has been taking Pentasa for this.  He apparently is being referred to a specialist at [facility].  As far as his urothelial cancer goes, this has not been a problem as far as he knows.  He has had no hematuria.  He has had no pain with urination.  His daughter is getting married next May.  He is looking forward to this.  He is still smoking.  May be, he might stop or slow down because of the Crohn's disease.  He has had no fever.  He has had no rashes.  He has had no leg swelling.  Overall, his performance status is ECOG 0.    Medications:  Current Outpatient Medications:  .  benazepril (LOTENSIN) 20 MG tablet, Take 20 mg by mouth daily., Disp: , Rfl:  .  bisoprolol (ZEBETA) 5 MG tablet, TAKE 1 TABLET BY MOUTH EVERY DAY, Disp: 90 tablet, Rfl: 0 .  budesonide (ENTOCORT EC) 3 MG 24 hr capsule, Take 3 mg by mouth daily., Disp: , Rfl:  .  citalopram (CELEXA) 20 MG tablet, Take 20 mg by mouth daily. , Disp: , Rfl: 1 .  doxycycline (VIBRA-TABS) 100 MG tablet, , Disp: , Rfl:  .  fenofibrate micronized (LOFIBRA) 134 MG capsule, Take 134 mg by mouth daily before breakfast. , Disp: , Rfl: 4 .  mirtazapine (REMERON) 15 MG tablet, Take 15 mg by mouth at bedtime., Disp: , Rfl:  .  mometasone-formoterol (DULERA) 100-5 MCG/ACT AERO, Inhale 2 puffs into the lungs 2 (two) times daily., Disp: 1 Inhaler, Rfl: 11 .  PENTASA 500 MG CR capsule, 1,000 mg 2 (two) times daily. , Disp: , Rfl:   Allergies:  Allergies    Allergen Reactions  . Other Other (See Comments)  . Phenazopyridine Rash    Past Medical History, Surgical history, Social history, and Family History were reviewed and updated.  Review of Systems: Review of Systems  All other systems reviewed and are negative.  Physical Exam:  weight is 199 lb (90.3 kg). His oral temperature is 97.6 F (36.4 C). His blood pressure is 136/59 (abnormal) and his pulse is 66. His respiration is 18 and oxygen saturation is 94%.   Wt Readings from Last 3 Encounters:  09/12/17 199 lb (90.3 kg)  07/01/17 209 lb (94.8 kg)  05/20/17 198 lb 3.2 oz (89.9 kg)     Physical Exam  Constitutional: He is oriented to person, place, and time.  HENT:  Head: Normocephalic and atraumatic.  Mouth/Throat: Oropharynx is clear and moist.  Eyes: Pupils are equal, round, and reactive to light. EOM are normal.  Neck: Normal range of motion.  Cardiovascular: Normal rate, regular rhythm and normal heart sounds.  Pulmonary/Chest: Effort normal and breath sounds normal.  Abdominal: Soft. Bowel sounds are normal.  Musculoskeletal: Normal range of motion. He exhibits no edema, tenderness or deformity.  Lymphadenopathy:    He has no cervical adenopathy.  Neurological: He is alert and  oriented to person, place, and time.  Skin: Skin is warm and dry. No rash noted. No erythema.  Psychiatric: He has a normal mood and affect. His behavior is normal. Judgment and thought content normal.  Vitals reviewed.    Lab Results  Component Value Date   WBC 12.9 (H) 09/12/2017   HGB 16.7 09/12/2017   HCT 49.4 09/12/2017   MCV 90.3 09/12/2017   PLT 328 09/12/2017     Chemistry      Component Value Date/Time   NA 143 12/06/2015 1244   K 4.5 12/06/2015 1244   CL 107 12/06/2015 1244   CO2 24 11/07/2006 1323   BUN 22 (H) 12/06/2015 1244   CREATININE 1.10 12/06/2015 1244      Component Value Date/Time   CALCIUM 9.3 11/07/2006 1323   ALKPHOS 69 11/07/2006 1323   AST 18  11/07/2006 1323   ALT 19 11/07/2006 1323   BILITOT 0.4 11/07/2006 1323         Impression and Plan: Mr. Banh is a 67 year old male. He has chronic leukocytosis.   His blood smear still looks okay. The white blood cells appear reactive. I do not see any immature myeloid cells.  I think that as long as he is smoking, he probably will have some degree of leukocytosis.  I think we can get him back in 1 year now.  We really are not doing much for his health care overall.  I think the leukocytosis is reactive and he will always have this.  As always, he is a lot of fun to talk to.    Volanda Napoleon, MD 6/21/20192:38 PM

## 2017-09-17 DIAGNOSIS — K5000 Crohn's disease of small intestine without complications: Secondary | ICD-10-CM | POA: Diagnosis not present

## 2017-09-17 DIAGNOSIS — Z1159 Encounter for screening for other viral diseases: Secondary | ICD-10-CM | POA: Diagnosis not present

## 2017-09-22 DIAGNOSIS — D4122 Neoplasm of uncertain behavior of left ureter: Secondary | ICD-10-CM | POA: Diagnosis not present

## 2017-10-27 DIAGNOSIS — K633 Ulcer of intestine: Secondary | ICD-10-CM | POA: Diagnosis not present

## 2017-10-27 DIAGNOSIS — K5289 Other specified noninfective gastroenteritis and colitis: Secondary | ICD-10-CM | POA: Diagnosis not present

## 2017-10-27 DIAGNOSIS — K5000 Crohn's disease of small intestine without complications: Secondary | ICD-10-CM | POA: Diagnosis not present

## 2017-10-29 DIAGNOSIS — K5289 Other specified noninfective gastroenteritis and colitis: Secondary | ICD-10-CM | POA: Diagnosis not present

## 2018-04-02 DIAGNOSIS — K5000 Crohn's disease of small intestine without complications: Secondary | ICD-10-CM | POA: Diagnosis not present

## 2018-04-22 DIAGNOSIS — K5000 Crohn's disease of small intestine without complications: Secondary | ICD-10-CM | POA: Diagnosis not present

## 2018-04-27 DIAGNOSIS — F324 Major depressive disorder, single episode, in partial remission: Secondary | ICD-10-CM | POA: Diagnosis not present

## 2018-04-27 DIAGNOSIS — I10 Essential (primary) hypertension: Secondary | ICD-10-CM | POA: Diagnosis not present

## 2018-04-27 DIAGNOSIS — Z125 Encounter for screening for malignant neoplasm of prostate: Secondary | ICD-10-CM | POA: Diagnosis not present

## 2018-04-27 DIAGNOSIS — E782 Mixed hyperlipidemia: Secondary | ICD-10-CM | POA: Diagnosis not present

## 2018-04-27 DIAGNOSIS — D72829 Elevated white blood cell count, unspecified: Secondary | ICD-10-CM | POA: Diagnosis not present

## 2018-05-05 DIAGNOSIS — G4700 Insomnia, unspecified: Secondary | ICD-10-CM | POA: Diagnosis not present

## 2018-05-05 DIAGNOSIS — F324 Major depressive disorder, single episode, in partial remission: Secondary | ICD-10-CM | POA: Diagnosis not present

## 2018-05-05 DIAGNOSIS — Z23 Encounter for immunization: Secondary | ICD-10-CM | POA: Diagnosis not present

## 2018-05-05 DIAGNOSIS — E782 Mixed hyperlipidemia: Secondary | ICD-10-CM | POA: Diagnosis not present

## 2018-05-05 DIAGNOSIS — F17200 Nicotine dependence, unspecified, uncomplicated: Secondary | ICD-10-CM | POA: Diagnosis not present

## 2018-05-05 DIAGNOSIS — Z0000 Encounter for general adult medical examination without abnormal findings: Secondary | ICD-10-CM | POA: Diagnosis not present

## 2018-05-05 DIAGNOSIS — F411 Generalized anxiety disorder: Secondary | ICD-10-CM | POA: Diagnosis not present

## 2018-05-05 DIAGNOSIS — I10 Essential (primary) hypertension: Secondary | ICD-10-CM | POA: Diagnosis not present

## 2018-05-05 DIAGNOSIS — N183 Chronic kidney disease, stage 3 (moderate): Secondary | ICD-10-CM | POA: Diagnosis not present

## 2018-05-21 DIAGNOSIS — K5000 Crohn's disease of small intestine without complications: Secondary | ICD-10-CM | POA: Diagnosis not present

## 2018-06-02 DIAGNOSIS — K5000 Crohn's disease of small intestine without complications: Secondary | ICD-10-CM | POA: Diagnosis not present

## 2018-06-16 DIAGNOSIS — K5000 Crohn's disease of small intestine without complications: Secondary | ICD-10-CM | POA: Diagnosis not present

## 2018-07-14 DIAGNOSIS — K5000 Crohn's disease of small intestine without complications: Secondary | ICD-10-CM | POA: Diagnosis not present

## 2018-07-15 ENCOUNTER — Other Ambulatory Visit: Payer: Self-pay | Admitting: Internal Medicine

## 2018-08-05 DIAGNOSIS — K5000 Crohn's disease of small intestine without complications: Secondary | ICD-10-CM | POA: Diagnosis not present

## 2018-08-10 DIAGNOSIS — R05 Cough: Secondary | ICD-10-CM | POA: Diagnosis not present

## 2018-08-10 DIAGNOSIS — I10 Essential (primary) hypertension: Secondary | ICD-10-CM | POA: Diagnosis not present

## 2018-08-27 DIAGNOSIS — H9202 Otalgia, left ear: Secondary | ICD-10-CM | POA: Diagnosis not present

## 2018-08-27 DIAGNOSIS — R42 Dizziness and giddiness: Secondary | ICD-10-CM | POA: Diagnosis not present

## 2018-09-08 DIAGNOSIS — K5000 Crohn's disease of small intestine without complications: Secondary | ICD-10-CM | POA: Diagnosis not present

## 2018-09-09 ENCOUNTER — Telehealth: Payer: Self-pay | Admitting: Hematology & Oncology

## 2018-09-09 NOTE — Telephone Encounter (Signed)
Returned call to patient to confirm appt date/time per vm from 6/17

## 2018-09-14 ENCOUNTER — Inpatient Hospital Stay: Payer: Medicare Other | Attending: Hematology & Oncology | Admitting: Hematology & Oncology

## 2018-09-14 ENCOUNTER — Encounter: Payer: Self-pay | Admitting: Hematology & Oncology

## 2018-09-14 ENCOUNTER — Other Ambulatory Visit: Payer: Self-pay | Admitting: *Deleted

## 2018-09-14 ENCOUNTER — Other Ambulatory Visit: Payer: Self-pay

## 2018-09-14 ENCOUNTER — Inpatient Hospital Stay: Payer: Medicare Other

## 2018-09-14 VITALS — BP 145/63 | HR 50 | Temp 98.1°F | Resp 18 | Wt 198.0 lb

## 2018-09-14 DIAGNOSIS — D72823 Leukemoid reaction: Secondary | ICD-10-CM

## 2018-09-14 DIAGNOSIS — I10 Essential (primary) hypertension: Secondary | ICD-10-CM

## 2018-09-14 DIAGNOSIS — Z720 Tobacco use: Secondary | ICD-10-CM | POA: Diagnosis not present

## 2018-09-14 DIAGNOSIS — D72828 Other elevated white blood cell count: Secondary | ICD-10-CM | POA: Diagnosis not present

## 2018-09-14 LAB — CMP (CANCER CENTER ONLY)
ALT: 15 U/L (ref 0–44)
AST: 19 U/L (ref 15–41)
Albumin: 4.2 g/dL (ref 3.5–5.0)
Alkaline Phosphatase: 51 U/L (ref 38–126)
Anion gap: 10 (ref 5–15)
BUN: 14 mg/dL (ref 8–23)
CO2: 23 mmol/L (ref 22–32)
Calcium: 9.5 mg/dL (ref 8.9–10.3)
Chloride: 104 mmol/L (ref 98–111)
Creatinine: 1.26 mg/dL — ABNORMAL HIGH (ref 0.61–1.24)
GFR, Est AFR Am: >60 mL/min (ref >60)
GFR, Estimated: 59 mL/min — ABNORMAL LOW (ref >60)
Glucose, Bld: 148 mg/dL — ABNORMAL HIGH (ref 70–99)
Potassium: 4.1 mmol/L (ref 3.5–5.1)
Sodium: 137 mmol/L (ref 135–145)
Total Bilirubin: 0.4 mg/dL (ref 0.3–1.2)
Total Protein: 6.6 g/dL (ref 6.5–8.1)

## 2018-09-14 LAB — CBC WITH DIFFERENTIAL (CANCER CENTER ONLY)
Abs Immature Granulocytes: 0.05 10*3/uL (ref 0.00–0.07)
Basophils Absolute: 0.1 10*3/uL (ref 0.0–0.1)
Basophils Relative: 1 %
Eosinophils Absolute: 0.3 10*3/uL (ref 0.0–0.5)
Eosinophils Relative: 3 %
HCT: 49.8 % (ref 39.0–52.0)
Hemoglobin: 16.5 g/dL (ref 13.0–17.0)
Immature Granulocytes: 1 %
Lymphocytes Relative: 14 %
Lymphs Abs: 1.5 10*3/uL (ref 0.7–4.0)
MCH: 31.5 pg (ref 26.0–34.0)
MCHC: 33.1 g/dL (ref 30.0–36.0)
MCV: 95.2 fL (ref 80.0–100.0)
Monocytes Absolute: 0.6 10*3/uL (ref 0.1–1.0)
Monocytes Relative: 5 %
Neutro Abs: 8.4 10*3/uL — ABNORMAL HIGH (ref 1.7–7.7)
Neutrophils Relative %: 76 %
Platelet Count: 326 10*3/uL (ref 150–400)
RBC: 5.23 MIL/uL (ref 4.22–5.81)
RDW: 14.5 % (ref 11.5–15.5)
WBC Count: 10.9 10*3/uL — ABNORMAL HIGH (ref 4.0–10.5)
nRBC: 0 % (ref 0.0–0.2)

## 2018-09-14 LAB — SAVE SMEAR (SSMR)

## 2018-09-14 NOTE — Progress Notes (Signed)
Hematology and Oncology Follow Up Visit  Zachary Oneal [id number] July 12, 1950 68 y.o. 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  He is doing fairly well.  It is been about a year since we last saw him.  He now is on Entyvio for his Crohn's disease.  He gets this every 8 weeks.  He is doing well with this.  He has had no problems with bleeding.  He has had no urinary issues.  He has had no fever.  He has been busy painting his house.  As always, he and I talked a lot about painting.  He is very skilled at this.  He has had no swollen lymph nodes.  There has been no rashes.  He has had no leg swelling.  Overall, his performance status is ECOG 0.  Medications:  Current Outpatient Medications:  .  aspirin 325 MG tablet, Take 325 mg by mouth daily., Disp: , Rfl:  .  b complex vitamins capsule, Take 1 capsule by mouth daily., Disp: , Rfl:  .  Cholecalciferol (VITAMIN D3) 50 MCG (2000 UT) TABS, Take 50 mcg by mouth daily., Disp: , Rfl:  .  HYDROXYZINE HCL PO, Take 10 mg by mouth daily., Disp: , Rfl:  .  Multiple Vitamins-Minerals (MULTIVITAMIN ADULTS 50+ PO), Take 1 tablet by mouth daily., Disp: , Rfl:  .  Vedolizumab (ENTYVIO IV), Inject into the vein every 8 (eight) weeks. Started on 06/02/18, last dose 07/14/18., Disp: , Rfl:  .  bisoprolol (ZEBETA) 5 MG tablet, TAKE 1 TABLET BY MOUTH EVERY DAY, Disp: 90 tablet, Rfl: 0 .  citalopram (CELEXA) 20 MG tablet, Take 20 mg by mouth daily. , Disp: , Rfl: 1 .  fenofibrate micronized (LOFIBRA) 134 MG capsule, Take 134 mg by mouth daily before breakfast. , Disp: , Rfl: 4 .  mirtazapine (REMERON) 15 MG tablet, Take 15 mg by mouth at bedtime., Disp: , Rfl:   Allergies:  Allergies  Allergen Reactions  . Hydrocodone-Acetaminophen Itching  . Other Other (See  Comments)  . Phenazopyridine Rash    Past Medical History, Surgical history, Social history, and Family History were reviewed and updated.  Review of Systems: Review of Systems  All other systems reviewed and are negative.  Physical Exam:  weight is 198 lb (89.8 kg). His oral temperature is 98.1 F (36.7 C). His blood pressure is 145/63 (abnormal) and his pulse is 50 (abnormal). His respiration is 18 and oxygen saturation is 95%.   Wt Readings from Last 3 Encounters:  09/14/18 198 lb (89.8 kg)  09/12/17 199 lb (90.3 kg)  07/01/17 209 lb (94.8 kg)     Physical Exam Vitals signs reviewed.  HENT:     Head: Normocephalic and atraumatic.  Eyes:     Pupils: Pupils are equal, round, and reactive to light.  Neck:     Musculoskeletal: Normal range of motion.  Cardiovascular:     Rate and Rhythm: Normal rate and regular rhythm.     Heart sounds: Normal heart sounds.  Pulmonary:     Effort: Pulmonary effort is normal.     Breath sounds: Normal breath sounds.  Abdominal:     General: Bowel sounds are normal.     Palpations: Abdomen is soft.  Musculoskeletal: Normal range of motion.  General: No tenderness or deformity.  Lymphadenopathy:     Cervical: No cervical adenopathy.  Skin:    General: Skin is warm and dry.     Findings: No erythema or rash.  Neurological:     Mental Status: He is alert and oriented to person, place, and time.  Psychiatric:        Behavior: Behavior normal.        Thought Content: Thought content normal.        Judgment: Judgment normal.      Lab Results  Component Value Date   WBC 10.9 (H) 09/14/2018   HGB 16.5 09/14/2018   HCT 49.8 09/14/2018   MCV 95.2 09/14/2018   PLT 326 09/14/2018     Chemistry      Component Value Date/Time   NA 137 09/14/2018 1130   K 4.1 09/14/2018 1130   CL 104 09/14/2018 1130   CO2 23 09/14/2018 1130   BUN 14 09/14/2018 1130   CREATININE 1.26 (H) 09/14/2018 1130      Component Value Date/Time    CALCIUM 9.5 09/14/2018 1130   ALKPHOS 51 09/14/2018 1130   AST 19 09/14/2018 1130   ALT 15 09/14/2018 1130   BILITOT 0.4 09/14/2018 1130         Impression and Plan: Zachary Oneal is a 68 year old male. He has chronic leukocytosis.   His blood smear still looks okay. The white blood cells appear reactive. I do not see any immature myeloid cells.  I think that as long as he is smoking, he probably will have some degree of leukocytosis.  I think we can get him back in 1 year now.  We really are not doing much for his health care overall.  I think the leukocytosis is reactive and he will always have this.  As always, he is a lot of fun to talk to.    Volanda Napoleon, MD 6/22/202012:46 PM

## 2018-09-28 DIAGNOSIS — C652 Malignant neoplasm of left renal pelvis: Secondary | ICD-10-CM | POA: Diagnosis not present

## 2018-09-28 DIAGNOSIS — N3289 Other specified disorders of bladder: Secondary | ICD-10-CM | POA: Diagnosis not present

## 2018-09-28 DIAGNOSIS — N400 Enlarged prostate without lower urinary tract symptoms: Secondary | ICD-10-CM | POA: Diagnosis not present

## 2018-11-03 DIAGNOSIS — K5000 Crohn's disease of small intestine without complications: Secondary | ICD-10-CM | POA: Diagnosis not present

## 2018-11-10 DIAGNOSIS — K5000 Crohn's disease of small intestine without complications: Secondary | ICD-10-CM | POA: Diagnosis not present

## 2018-11-20 DIAGNOSIS — Z23 Encounter for immunization: Secondary | ICD-10-CM | POA: Diagnosis not present

## 2018-12-04 DIAGNOSIS — I10 Essential (primary) hypertension: Secondary | ICD-10-CM | POA: Diagnosis not present

## 2018-12-29 DIAGNOSIS — K5000 Crohn's disease of small intestine without complications: Secondary | ICD-10-CM | POA: Diagnosis not present

## 2019-02-23 DIAGNOSIS — K5000 Crohn's disease of small intestine without complications: Secondary | ICD-10-CM | POA: Diagnosis not present

## 2019-04-06 DIAGNOSIS — K5000 Crohn's disease of small intestine without complications: Secondary | ICD-10-CM | POA: Diagnosis not present

## 2019-04-20 DIAGNOSIS — K5000 Crohn's disease of small intestine without complications: Secondary | ICD-10-CM | POA: Diagnosis not present

## 2019-04-23 ENCOUNTER — Ambulatory Visit: Payer: Medicare Other

## 2019-04-27 DIAGNOSIS — C652 Malignant neoplasm of left renal pelvis: Secondary | ICD-10-CM | POA: Diagnosis not present

## 2019-04-29 ENCOUNTER — Ambulatory Visit: Payer: Federal, State, Local not specified - PPO | Attending: Internal Medicine

## 2019-04-29 DIAGNOSIS — Z23 Encounter for immunization: Secondary | ICD-10-CM

## 2019-04-29 NOTE — Progress Notes (Signed)
   Covid-19 Vaccination Clinic  Name:  Zachary Oneal    MRN: [id number] DOB: 1950/10/22  04/29/2019  [name] was observed post Covid-19 immunization for 15 minutes without incidence. He was provided with Vaccine Information Sheet and instruction to access the V-Safe system.   [name] was instructed to call 911 with any severe reactions post vaccine: Marland Kitchen Difficulty breathing  . Swelling of your face and throat  . A fast heartbeat  . A bad rash all over your body  . Dizziness and weakness    Immunizations Administered    Name Date Dose VIS Date Route   Pfizer COVID-19 Vaccine 04/29/2019  3:24 PM 0.3 mL 03/05/2019 Intramuscular   Manufacturer: [place]   Lot: [id number]   [place]: [id number]

## 2019-05-10 ENCOUNTER — Ambulatory Visit: Payer: Medicare Other

## 2019-05-25 ENCOUNTER — Ambulatory Visit: Payer: Federal, State, Local not specified - PPO | Attending: Internal Medicine

## 2019-05-25 DIAGNOSIS — Z23 Encounter for immunization: Secondary | ICD-10-CM | POA: Insufficient documentation

## 2019-05-25 NOTE — Progress Notes (Signed)
   Covid-19 Vaccination Clinic  Name:  Zachary Oneal    MRN: [id number] DOB: February 19, 1951  05/25/2019  [name] was observed post Covid-19 immunization for 15 minutes without incident. He was provided with Vaccine Information Sheet and instruction to access the V-Safe system.   [name] was instructed to call 911 with any severe reactions post vaccine: Marland Kitchen Difficulty breathing  . Swelling of face and throat  . A fast heartbeat  . A bad rash all over body  . Dizziness and weakness   Immunizations Administered    Name Date Dose VIS Date Route   Pfizer COVID-19 Vaccine 05/25/2019  9:28 AM 0.3 mL 03/05/2019 Intramuscular   Manufacturer: [place]   Lot: [id number]   [place]: [id number]

## 2019-06-02 DIAGNOSIS — J449 Chronic obstructive pulmonary disease, unspecified: Secondary | ICD-10-CM | POA: Diagnosis not present

## 2019-06-02 DIAGNOSIS — F411 Generalized anxiety disorder: Secondary | ICD-10-CM | POA: Diagnosis not present

## 2019-06-02 DIAGNOSIS — F17200 Nicotine dependence, unspecified, uncomplicated: Secondary | ICD-10-CM | POA: Diagnosis not present

## 2019-06-02 DIAGNOSIS — I10 Essential (primary) hypertension: Secondary | ICD-10-CM | POA: Diagnosis not present

## 2019-06-02 DIAGNOSIS — G4700 Insomnia, unspecified: Secondary | ICD-10-CM | POA: Diagnosis not present

## 2019-06-02 DIAGNOSIS — Z125 Encounter for screening for malignant neoplasm of prostate: Secondary | ICD-10-CM | POA: Diagnosis not present

## 2019-06-02 DIAGNOSIS — Z0000 Encounter for general adult medical examination without abnormal findings: Secondary | ICD-10-CM | POA: Diagnosis not present

## 2019-06-02 DIAGNOSIS — F324 Major depressive disorder, single episode, in partial remission: Secondary | ICD-10-CM | POA: Diagnosis not present

## 2019-06-02 DIAGNOSIS — N183 Chronic kidney disease, stage 3 unspecified: Secondary | ICD-10-CM | POA: Diagnosis not present

## 2019-06-02 DIAGNOSIS — E782 Mixed hyperlipidemia: Secondary | ICD-10-CM | POA: Diagnosis not present

## 2019-06-11 DIAGNOSIS — Z125 Encounter for screening for malignant neoplasm of prostate: Secondary | ICD-10-CM | POA: Diagnosis not present

## 2019-06-11 DIAGNOSIS — G4700 Insomnia, unspecified: Secondary | ICD-10-CM | POA: Diagnosis not present

## 2019-06-11 DIAGNOSIS — Z0000 Encounter for general adult medical examination without abnormal findings: Secondary | ICD-10-CM | POA: Diagnosis not present

## 2019-06-11 DIAGNOSIS — E782 Mixed hyperlipidemia: Secondary | ICD-10-CM | POA: Diagnosis not present

## 2019-06-11 DIAGNOSIS — F411 Generalized anxiety disorder: Secondary | ICD-10-CM | POA: Diagnosis not present

## 2019-06-11 DIAGNOSIS — F17200 Nicotine dependence, unspecified, uncomplicated: Secondary | ICD-10-CM | POA: Diagnosis not present

## 2019-06-11 DIAGNOSIS — N183 Chronic kidney disease, stage 3 unspecified: Secondary | ICD-10-CM | POA: Diagnosis not present

## 2019-06-11 DIAGNOSIS — I10 Essential (primary) hypertension: Secondary | ICD-10-CM | POA: Diagnosis not present

## 2019-06-11 DIAGNOSIS — F324 Major depressive disorder, single episode, in partial remission: Secondary | ICD-10-CM | POA: Diagnosis not present

## 2019-06-15 DIAGNOSIS — K5000 Crohn's disease of small intestine without complications: Secondary | ICD-10-CM | POA: Diagnosis not present

## 2019-07-21 DIAGNOSIS — M7022 Olecranon bursitis, left elbow: Secondary | ICD-10-CM | POA: Diagnosis not present

## 2019-08-10 DIAGNOSIS — K5000 Crohn's disease of small intestine without complications: Secondary | ICD-10-CM | POA: Diagnosis not present

## 2019-09-14 ENCOUNTER — Ambulatory Visit: Payer: Medicare Other | Admitting: Hematology & Oncology

## 2019-09-14 ENCOUNTER — Other Ambulatory Visit: Payer: Medicare Other

## 2019-09-23 ENCOUNTER — Other Ambulatory Visit: Payer: Self-pay | Admitting: *Deleted

## 2019-09-23 DIAGNOSIS — D72823 Leukemoid reaction: Secondary | ICD-10-CM

## 2019-09-24 ENCOUNTER — Inpatient Hospital Stay (HOSPITAL_BASED_OUTPATIENT_CLINIC_OR_DEPARTMENT_OTHER): Payer: Medicare Other | Admitting: Hematology & Oncology

## 2019-09-24 ENCOUNTER — Inpatient Hospital Stay: Payer: Medicare Other | Attending: Hematology & Oncology

## 2019-09-24 ENCOUNTER — Encounter: Payer: Self-pay | Admitting: Hematology & Oncology

## 2019-09-24 ENCOUNTER — Other Ambulatory Visit: Payer: Self-pay

## 2019-09-24 VITALS — BP 118/56 | HR 53 | Temp 98.6°F | Resp 16 | Ht 72.5 in | Wt 191.0 lb

## 2019-09-24 DIAGNOSIS — D72823 Leukemoid reaction: Secondary | ICD-10-CM | POA: Diagnosis not present

## 2019-09-24 DIAGNOSIS — Z720 Tobacco use: Secondary | ICD-10-CM | POA: Diagnosis not present

## 2019-09-24 DIAGNOSIS — D72828 Other elevated white blood cell count: Secondary | ICD-10-CM | POA: Diagnosis not present

## 2019-09-24 DIAGNOSIS — K5090 Crohn's disease, unspecified, without complications: Secondary | ICD-10-CM | POA: Insufficient documentation

## 2019-09-24 LAB — CBC WITH DIFFERENTIAL (CANCER CENTER ONLY)
Abs Immature Granulocytes: 0.07 10*3/uL (ref 0.00–0.07)
Basophils Absolute: 0.1 10*3/uL (ref 0.0–0.1)
Basophils Relative: 1 %
Eosinophils Absolute: 0.3 10*3/uL (ref 0.0–0.5)
Eosinophils Relative: 2 %
HCT: 45.8 % (ref 39.0–52.0)
Hemoglobin: 15.3 g/dL (ref 13.0–17.0)
Immature Granulocytes: 1 %
Lymphocytes Relative: 12 %
Lymphs Abs: 1.7 10*3/uL (ref 0.7–4.0)
MCH: 31.9 pg (ref 26.0–34.0)
MCHC: 33.4 g/dL (ref 30.0–36.0)
MCV: 95.4 fL (ref 80.0–100.0)
Monocytes Absolute: 0.7 10*3/uL (ref 0.1–1.0)
Monocytes Relative: 5 %
Neutro Abs: 11 10*3/uL — ABNORMAL HIGH (ref 1.7–7.7)
Neutrophils Relative %: 79 %
Platelet Count: 335 10*3/uL (ref 150–400)
RBC: 4.8 MIL/uL (ref 4.22–5.81)
RDW: 13.9 % (ref 11.5–15.5)
WBC Count: 13.8 10*3/uL — ABNORMAL HIGH (ref 4.0–10.5)
nRBC: 0 % (ref 0.0–0.2)

## 2019-09-24 LAB — CMP (CANCER CENTER ONLY)
ALT: 11 U/L (ref 0–44)
AST: 15 U/L (ref 15–41)
Albumin: 4.2 g/dL (ref 3.5–5.0)
Alkaline Phosphatase: 48 U/L (ref 38–126)
Anion gap: 6 (ref 5–15)
BUN: 18 mg/dL (ref 8–23)
CO2: 27 mmol/L (ref 22–32)
Calcium: 9.3 mg/dL (ref 8.9–10.3)
Chloride: 106 mmol/L (ref 98–111)
Creatinine: 1.49 mg/dL — ABNORMAL HIGH (ref 0.61–1.24)
GFR, Est AFR Am: 55 mL/min — ABNORMAL LOW (ref >60)
GFR, Estimated: 48 mL/min — ABNORMAL LOW (ref >60)
Glucose, Bld: 125 mg/dL — ABNORMAL HIGH (ref 70–99)
Potassium: 4.6 mmol/L (ref 3.5–5.1)
Sodium: 139 mmol/L (ref 135–145)
Total Bilirubin: 0.3 mg/dL (ref 0.3–1.2)
Total Protein: 6.7 g/dL (ref 6.5–8.1)

## 2019-09-24 LAB — SAVE SMEAR(SSMR), FOR PROVIDER SLIDE REVIEW

## 2019-09-24 NOTE — Progress Notes (Signed)
Hematology and Oncology Follow Up Visit  Zachary Oneal [id number] 28-Nov-1950 69 y.o. 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  He is doing fairly well.  It is been about a year since we last saw him.  He is on Entyvio for his Crohn's disease.  He gets this every 8 weeks.  He is doing well with this.  He has had no problems with bleeding.  He has had no urinary issues.  He has had no fever.  He has had some problems with his left knee.  This seems to be actually in the left tibial plateau.  It causes some pain on occasion.  He has not yet seen an orthopedist.  He wants to try to hold off on this.  He has been vaccinated for the coronavirus.  Thankfully, no one in his family he has had the coronavirus.    He has had no swollen lymph nodes.  There has been no rashes.  He has had no leg swelling.  Overall, his performance status is ECOG 0.  Medications:  Current Outpatient Medications:    amLODipine (NORVASC) 5 MG tablet, Take 5 mg by mouth daily., Disp: , Rfl:    aspirin 325 MG tablet, Take 325 mg by mouth daily., Disp: , Rfl:    b complex vitamins capsule, Take 1 capsule by mouth daily., Disp: , Rfl:    bisoprolol (ZEBETA) 5 MG tablet, TAKE 1 TABLET BY MOUTH EVERY DAY, Disp: 90 tablet, Rfl: 0   buPROPion (WELLBUTRIN XL) 150 MG 24 hr tablet, Take 150 mg by mouth every morning., Disp: , Rfl:    Cholecalciferol (VITAMIN D3) 50 MCG (2000 UT) TABS, Take 50 mcg by mouth daily., Disp: , Rfl:    fenofibrate micronized (LOFIBRA) 134 MG capsule, Take 134 mg by mouth daily before breakfast. , Disp: , Rfl: 4   fluticasone (FLONASE) 50 MCG/ACT nasal spray, Place into both nostrils., Disp: , Rfl:    Multiple Vitamins-Minerals (MULTIVITAMIN ADULTS 50+ PO), Take 1 tablet by mouth daily., Disp: , Rfl:     Vedolizumab (ENTYVIO IV), Inject into the vein every 8 (eight) weeks. Started on 06/02/18, last dose 07/14/18., Disp: , Rfl:   Allergies:  Allergies  Allergen Reactions   Hydrocodone-Acetaminophen Itching   Other Other (See Comments)   Phenazopyridine Rash    Past Medical History, Surgical history, Social history, and Family History were reviewed and updated.  Review of Systems: Review of Systems  All other systems reviewed and are negative.  Physical Exam:  height is 6' 0.5" (1.842 m) and weight is 191 lb (86.6 kg). His oral temperature is 98.6 F (37 C). His blood pressure is 118/56 (abnormal) and his pulse is 53 (abnormal). His respiration is 16 and oxygen saturation is 96%.   Wt Readings from Last 3 Encounters:  09/24/19 191 lb (86.6 kg)  09/14/18 198 lb (89.8 kg)  09/12/17 199 lb (90.3 kg)     Physical Exam Vitals reviewed.  HENT:     Head: Normocephalic and atraumatic.  Eyes:     Pupils: Pupils are equal, round, and reactive to light.  Cardiovascular:     Rate and Rhythm: Normal rate and regular rhythm.     Heart sounds: Normal heart sounds.  Pulmonary:     Effort: Pulmonary effort is normal.  Breath sounds: Normal breath sounds.  Abdominal:     General: Bowel sounds are normal.     Palpations: Abdomen is soft.  Musculoskeletal:        General: No tenderness or deformity. Normal range of motion.     Cervical back: Normal range of motion.  Lymphadenopathy:     Cervical: No cervical adenopathy.  Skin:    General: Skin is warm and dry.     Findings: No erythema or rash.  Neurological:     Mental Status: He is alert and oriented to person, place, and time.  Psychiatric:        Behavior: Behavior normal.        Thought Content: Thought content normal.        Judgment: Judgment normal.      Lab Results  Component Value Date   WBC 13.8 (H) 09/24/2019   HGB 15.3 09/24/2019   HCT 45.8 09/24/2019   MCV 95.4 09/24/2019   PLT 335 09/24/2019      Chemistry      Component Value Date/Time   NA 139 09/24/2019 1415   K 4.6 09/24/2019 1415   CL 106 09/24/2019 1415   CO2 27 09/24/2019 1415   BUN 18 09/24/2019 1415   CREATININE 1.49 (H) 09/24/2019 1415      Component Value Date/Time   CALCIUM 9.3 09/24/2019 1415   ALKPHOS 48 09/24/2019 1415   AST 15 09/24/2019 1415   ALT 11 09/24/2019 1415   BILITOT 0.3 09/24/2019 1415         Impression and Plan: Zachary Oneal is a 69 year old male. He has chronic leukocytosis.   His blood smear still looks okay. The white blood cells appear reactive. I do not see any immature myeloid cells.  I think that as long as he is smoking, he probably will have some degree of leukocytosis.  I think we can get him back in 1 year now.  We really are not doing much for his health care overall.  I think the leukocytosis is reactive and he will always have this.  As always, he is a lot of fun to talk to.    Volanda Napoleon, MD 7/2/20213:33 PM

## 2019-11-30 DIAGNOSIS — K5000 Crohn's disease of small intestine without complications: Secondary | ICD-10-CM | POA: Diagnosis not present

## 2019-12-21 DIAGNOSIS — Z23 Encounter for immunization: Secondary | ICD-10-CM | POA: Diagnosis not present

## 2020-01-05 DIAGNOSIS — Z23 Encounter for immunization: Secondary | ICD-10-CM | POA: Diagnosis not present

## 2020-01-12 DIAGNOSIS — K5000 Crohn's disease of small intestine without complications: Secondary | ICD-10-CM | POA: Diagnosis not present

## 2020-01-25 DIAGNOSIS — K5000 Crohn's disease of small intestine without complications: Secondary | ICD-10-CM | POA: Diagnosis not present

## 2020-03-21 DIAGNOSIS — K5000 Crohn's disease of small intestine without complications: Secondary | ICD-10-CM | POA: Diagnosis not present

## 2020-05-23 DIAGNOSIS — K5000 Crohn's disease of small intestine without complications: Secondary | ICD-10-CM | POA: Diagnosis not present

## 2020-06-01 DIAGNOSIS — I10 Essential (primary) hypertension: Secondary | ICD-10-CM | POA: Diagnosis not present

## 2020-06-01 DIAGNOSIS — Z125 Encounter for screening for malignant neoplasm of prostate: Secondary | ICD-10-CM | POA: Diagnosis not present

## 2020-06-01 DIAGNOSIS — E782 Mixed hyperlipidemia: Secondary | ICD-10-CM | POA: Diagnosis not present

## 2020-06-01 DIAGNOSIS — D72829 Elevated white blood cell count, unspecified: Secondary | ICD-10-CM | POA: Diagnosis not present

## 2020-06-06 DIAGNOSIS — F324 Major depressive disorder, single episode, in partial remission: Secondary | ICD-10-CM | POA: Diagnosis not present

## 2020-06-06 DIAGNOSIS — I10 Essential (primary) hypertension: Secondary | ICD-10-CM | POA: Diagnosis not present

## 2020-06-06 DIAGNOSIS — J449 Chronic obstructive pulmonary disease, unspecified: Secondary | ICD-10-CM | POA: Diagnosis not present

## 2020-06-06 DIAGNOSIS — Z125 Encounter for screening for malignant neoplasm of prostate: Secondary | ICD-10-CM | POA: Diagnosis not present

## 2020-06-06 DIAGNOSIS — Z0000 Encounter for general adult medical examination without abnormal findings: Secondary | ICD-10-CM | POA: Diagnosis not present

## 2020-06-06 DIAGNOSIS — E782 Mixed hyperlipidemia: Secondary | ICD-10-CM | POA: Diagnosis not present

## 2020-06-06 DIAGNOSIS — N183 Chronic kidney disease, stage 3 unspecified: Secondary | ICD-10-CM | POA: Diagnosis not present

## 2020-06-20 ENCOUNTER — Telehealth: Payer: Self-pay | Admitting: Acute Care

## 2020-06-20 DIAGNOSIS — Z87891 Personal history of nicotine dependence: Secondary | ICD-10-CM

## 2020-06-20 DIAGNOSIS — F17210 Nicotine dependence, cigarettes, uncomplicated: Secondary | ICD-10-CM

## 2020-06-21 NOTE — Telephone Encounter (Signed)
Left message for pt to call back  °

## 2020-06-21 NOTE — Telephone Encounter (Signed)
LMTC x 1  

## 2020-06-21 NOTE — Telephone Encounter (Signed)
Spoke with pt and scheduled SDMV 08/02/20 10:00 CT ordered and will be scheduled Pt verbalized understanding

## 2020-06-22 DIAGNOSIS — K529 Noninfective gastroenteritis and colitis, unspecified: Secondary | ICD-10-CM | POA: Diagnosis not present

## 2020-06-22 DIAGNOSIS — C652 Malignant neoplasm of left renal pelvis: Secondary | ICD-10-CM | POA: Diagnosis not present

## 2020-06-28 DIAGNOSIS — K5000 Crohn's disease of small intestine without complications: Secondary | ICD-10-CM | POA: Diagnosis not present

## 2020-07-18 DIAGNOSIS — K5000 Crohn's disease of small intestine without complications: Secondary | ICD-10-CM | POA: Diagnosis not present

## 2020-07-26 DIAGNOSIS — K5000 Crohn's disease of small intestine without complications: Secondary | ICD-10-CM | POA: Diagnosis not present

## 2020-08-02 ENCOUNTER — Encounter: Payer: Self-pay | Admitting: Acute Care

## 2020-08-02 ENCOUNTER — Ambulatory Visit (INDEPENDENT_AMBULATORY_CARE_PROVIDER_SITE_OTHER): Payer: Medicare Other | Admitting: Acute Care

## 2020-08-02 ENCOUNTER — Other Ambulatory Visit: Payer: Self-pay

## 2020-08-02 ENCOUNTER — Ambulatory Visit
Admission: RE | Admit: 2020-08-02 | Discharge: 2020-08-02 | Disposition: A | Discharge status: Home / Self Care | Payer: Medicare Other | Source: Ambulatory Visit | Attending: Acute Care | Admitting: Acute Care

## 2020-08-02 VITALS — BP 114/60 | HR 56 | Temp 97.3°F | Ht 73.0 in | Wt 200.6 lb

## 2020-08-02 DIAGNOSIS — Z87891 Personal history of nicotine dependence: Secondary | ICD-10-CM

## 2020-08-02 DIAGNOSIS — F17210 Nicotine dependence, cigarettes, uncomplicated: Secondary | ICD-10-CM | POA: Diagnosis not present

## 2020-08-02 NOTE — Patient Instructions (Signed)
Thank you for participating in the [facility]. It was our pleasure to meet you today. We will call you with the results of your scan within the next few days. Your scan will be assigned a Lung RADS category score by the physicians reading the scans.  This Lung RADS score determines follow up scanning.  See below for description of categories, and follow up screening recommendations. We will be in touch to schedule your follow up screening annually or based on recommendations of our providers. We will fax a copy of your scan results to your Primary Care Physician, or the physician who referred you to the program, to ensure they have the results. Please call the office if you have any questions or concerns regarding your scanning experience or results.  Our office number is [phone]. Please speak with Denise Phelps, RN. She is our Lung Cancer Screening RN. If she is unavailable when you call, please have the office staff send her a message. She will return your call at her earliest convenience. Remember, if your scan is normal, we will scan you annually as long as you continue to meet the criteria for the program. (Age 55-77, Current smoker or smoker who has quit within the last 15 years). If you are a smoker, remember, quitting is the single most powerful action that you can take to decrease your risk of lung cancer and other pulmonary, breathing related problems. We know quitting is hard, and we are here to help.  Please let us know if there is anything we can do to help you meet your goal of quitting. If you are a former smoker, congratulations. We are proud of you! Remain smoke free! Remember you can refer friends or family members through the number above.  We will screen them to make sure they meet criteria for the program. Thank you for helping us take better care of you by participating in Lung Screening.  Lung RADS Categories:  Lung RADS 1: no nodules  or definitely non-concerning nodules.  Recommendation is for a repeat annual scan in 12 months.  Lung RADS 2:  nodules that are non-concerning in appearance and behavior with a very low likelihood of becoming an active cancer. Recommendation is for a repeat annual scan in 12 months.  Lung RADS 3: nodules that are probably non-concerning , includes nodules with a low likelihood of becoming an active cancer.  Recommendation is for a 6-month repeat screening scan. Often noted after an upper respiratory illness. We will be in touch to make sure you have no questions, and to schedule your 6-month scan.  Lung RADS 4 A: nodules with concerning findings, recommendation is most often for a follow up scan in 3 months or additional testing based on our provider's assessment of the scan. We will be in touch to make sure you have no questions and to schedule the recommended 3 month follow up scan.  Lung RADS 4 B:  indicates findings that are concerning. We will be in touch with you to schedule additional diagnostic testing based on our provider's  assessment of the scan.   

## 2020-08-02 NOTE — Progress Notes (Signed)
Shared Decision Making Visit Lung Cancer Screening Program [phone])   Eligibility:  Age 70 y.o.  Pack Years Smoking History Calculation 47 pack year smoking history (# packs/per year x # years smoked)  Recent History of coughing up blood  no  Unexplained weight loss? no ( >Than 15 pounds within the last 6 months )  Prior History Lung / other cancer no (Diagnosis within the last 5 years already requiring surveillance chest CT Scans).  Smoking Status Current Smoker  Former Smokers: Years since quit: NA  Quit Date: NA  Visit Components:  Discussion included one or more decision making aids. yes  Discussion included risk/benefits of screening. yes  Discussion included potential follow up diagnostic testing for abnormal scans. yes  Discussion included meaning and risk of over diagnosis. yes  Discussion included meaning and risk of False Positives. yes  Discussion included meaning of total radiation exposure. yes  Counseling Included:  Importance of adherence to annual lung cancer LDCT screening. yes  Impact of comorbidities on ability to participate in the program. yes  Ability and willingness to under diagnostic treatment. yes  Smoking Cessation Counseling:  Current Smokers:   Discussed importance of smoking cessation. yes  Information about tobacco cessation classes and interventions provided to patient. yes  Patient provided with "ticket" for LDCT Scan. yes  Symptomatic Patient. no  Counseling NA  Diagnosis Code: Tobacco Use Z72.0  Asymptomatic Patient yes  Counseling (Intermediate counseling: > three minutes counseling) [id number]  Former Smokers:   Discussed the importance of maintaining cigarette abstinence. yes  Diagnosis Code: Personal History of Nicotine Dependence. X32.440  Information about tobacco cessation classes and interventions provided to patient. Yes  Patient provided with "ticket" for LDCT Scan. yes  Written Order for Lung Cancer  Screening with LDCT placed in Epic. Yes (CT Chest Lung Cancer Screening Low Dose W/O CM) [id number] Z12.2-Screening of respiratory organs Z87.891-Personal history of nicotine dependence  I have spent 25 minutes of face to face time with Mr. Tarnow  discussing the risks and benefits of lung cancer screening. We viewed a power point together that explained in detail the above noted topics. We paused at intervals to allow for questions to be asked and answered to ensure understanding.We discussed that the single most powerful action that he can take to decrease his risk of developing lung cancer is to quit smoking. We discussed whether or not he is ready to commit to setting a quit date. We discussed options for tools to aid in quitting smoking including nicotine replacement therapy, non-nicotine medications, support groups, Quit Smart classes, and behavior modification. We discussed that often times setting smaller, more achievable goals, such as eliminating 1 cigarette a day for a week and then 2 cigarettes a day for a week can be helpful in slowly decreasing the number of cigarettes smoked. This allows for a sense of accomplishment as well as providing a clinical benefit. I gave him the " Be Stronger Than Your Excuses" card with contact information for community resources, classes, free nicotine replacement therapy, and access to mobile apps, text messaging, and on-line smoking cessation help. I have also given him my card and contact information in the event he needs to contact me. We discussed the time and location of the scan, and that either Doroteo Glassman RN or I will call with the results within 24-48 hours of receiving them. I have offered him  a copy of the power point we viewed  as a resource in the event they need  reinforcement of the concepts we discussed today in the office. The patient verbalized understanding of all of  the above and had no further questions upon leaving the office. They have my  contact information in the event they have any further questions.  I spent 3-4 minutes counseling on smoking cessation and the health risks of continued tobacco abuse.  I explained to the patient that there has been a high incidence of coronary artery disease noted on these exams. I explained that this is a non-gated exam therefore degree or severity cannot be determined. This patient is on statin therapy. I have asked the patient to follow-up with their PCP regarding any incidental finding of coronary artery disease and management with diet or medication as their PCP  feels is clinically indicated. The patient verbalized understanding of the above and had no further questions upon completion of the visit.      Magdalen Spatz, NP 08/02/2020

## 2020-08-07 DIAGNOSIS — C652 Malignant neoplasm of left renal pelvis: Secondary | ICD-10-CM | POA: Diagnosis not present

## 2020-08-11 ENCOUNTER — Telehealth: Payer: Self-pay | Admitting: Acute Care

## 2020-08-11 DIAGNOSIS — F17210 Nicotine dependence, cigarettes, uncomplicated: Secondary | ICD-10-CM

## 2020-08-11 DIAGNOSIS — Z87891 Personal history of nicotine dependence: Secondary | ICD-10-CM

## 2020-08-11 NOTE — Progress Notes (Signed)
I have attempted to call the results of the low dose CT screening scan. There was no answer. Zachary Oneal, please call on Thursday or Friday if you have time. Lung  RADS 3, nodules that are probably benign findings, short term follow up suggested: includes nodules with a low likelihood of becoming a clinically active cancer. Radiology recommends a 6 month repeat LDCT follow up.  Will need a 6 month follow up, and results faxed to PCP. Thanks so much

## 2020-08-17 NOTE — Telephone Encounter (Signed)
Pt returning a phone call. Pt can be reached at [phone]

## 2020-08-17 NOTE — Telephone Encounter (Signed)
LMTC x 1  

## 2020-08-17 NOTE — Telephone Encounter (Signed)
My Chart message sent to pt regarding CT results.

## 2020-08-17 NOTE — Telephone Encounter (Signed)
Left another message for pt to call back if he has any further questions. Pt has reviewed and responded to My Chart message regarding CT results.

## 2020-09-11 DIAGNOSIS — K5000 Crohn's disease of small intestine without complications: Secondary | ICD-10-CM | POA: Diagnosis not present

## 2020-09-12 DIAGNOSIS — K5000 Crohn's disease of small intestine without complications: Secondary | ICD-10-CM | POA: Diagnosis not present

## 2020-09-22 ENCOUNTER — Inpatient Hospital Stay: Payer: Medicare Other | Attending: Hematology & Oncology | Admitting: Hematology & Oncology

## 2020-09-22 ENCOUNTER — Telehealth: Payer: Self-pay | Admitting: *Deleted

## 2020-09-22 ENCOUNTER — Encounter: Payer: Self-pay | Admitting: Hematology & Oncology

## 2020-09-22 ENCOUNTER — Inpatient Hospital Stay: Payer: Medicare Other

## 2020-09-22 ENCOUNTER — Other Ambulatory Visit: Payer: Self-pay

## 2020-09-22 DIAGNOSIS — D72823 Leukemoid reaction: Secondary | ICD-10-CM

## 2020-09-22 DIAGNOSIS — D72829 Elevated white blood cell count, unspecified: Secondary | ICD-10-CM | POA: Insufficient documentation

## 2020-09-22 DIAGNOSIS — F17200 Nicotine dependence, unspecified, uncomplicated: Secondary | ICD-10-CM | POA: Insufficient documentation

## 2020-09-22 LAB — CBC WITH DIFFERENTIAL (CANCER CENTER ONLY)
Abs Immature Granulocytes: 0.07 10*3/uL (ref 0.00–0.07)
Basophils Absolute: 0.1 10*3/uL (ref 0.0–0.1)
Basophils Relative: 1 %
Eosinophils Absolute: 0.5 10*3/uL (ref 0.0–0.5)
Eosinophils Relative: 3 %
HCT: 49.4 % (ref 39.0–52.0)
Hemoglobin: 16.6 g/dL (ref 13.0–17.0)
Immature Granulocytes: 1 %
Lymphocytes Relative: 15 %
Lymphs Abs: 2.2 10*3/uL (ref 0.7–4.0)
MCH: 31.5 pg (ref 26.0–34.0)
MCHC: 33.6 g/dL (ref 30.0–36.0)
MCV: 93.7 fL (ref 80.0–100.0)
Monocytes Absolute: 0.9 10*3/uL (ref 0.1–1.0)
Monocytes Relative: 6 %
Neutro Abs: 11.1 10*3/uL — ABNORMAL HIGH (ref 1.7–7.7)
Neutrophils Relative %: 74 %
Platelet Count: 267 10*3/uL (ref 150–400)
RBC: 5.27 MIL/uL (ref 4.22–5.81)
RDW: 13.4 % (ref 11.5–15.5)
WBC Count: 14.8 10*3/uL — ABNORMAL HIGH (ref 4.0–10.5)
nRBC: 0 % (ref 0.0–0.2)

## 2020-09-22 LAB — CMP (CANCER CENTER ONLY)
ALT: 26 U/L (ref 0–44)
AST: 21 U/L (ref 15–41)
Albumin: 4.1 g/dL (ref 3.5–5.0)
Alkaline Phosphatase: 57 U/L (ref 38–126)
Anion gap: 7 (ref 5–15)
BUN: 11 mg/dL (ref 8–23)
CO2: 29 mmol/L (ref 22–32)
Calcium: 9.8 mg/dL (ref 8.9–10.3)
Chloride: 102 mmol/L (ref 98–111)
Creatinine: 0.94 mg/dL (ref 0.61–1.24)
GFR, Estimated: >60 mL/min (ref >60)
Glucose, Bld: 108 mg/dL — ABNORMAL HIGH (ref 70–99)
Potassium: 4.1 mmol/L (ref 3.5–5.1)
Sodium: 138 mmol/L (ref 135–145)
Total Bilirubin: 0.4 mg/dL (ref 0.3–1.2)
Total Protein: 6.7 g/dL (ref 6.5–8.1)

## 2020-09-22 LAB — LACTATE DEHYDROGENASE: LDH: 120 U/L (ref 98–192)

## 2020-09-22 LAB — SAVE SMEAR(SSMR), FOR PROVIDER SLIDE REVIEW

## 2020-09-22 NOTE — Telephone Encounter (Signed)
Per 09/22/20 los - gave patient upcoming appointments - patient confirmed - view mychart

## 2020-09-22 NOTE — Progress Notes (Signed)
Hematology and Oncology Follow Up Visit  Zachary Oneal [id number] [birth year] 70 y.o. 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 Coller is back for follow-up.  It has been a year since I saw him.  He is doing quite well.  He has had no problems.  He does have Crohn's disease.  He is on Entyvio for this.  He said he might actually have to go to every 6 weeks for the Crohn's.  He has had no problems with bleeding.  He has had no issues with pain.  He has had no cough or shortness of breath.  He has been very diligent with avoiding the coronavirus.  There is been no rashes.  Over last saw him, he did have some problems with his left knee.  This seems to be doing a little bit better right now.  Overall, I would have to say his performance status is ECOG 0.    Medications:  Current Outpatient Medications:    citalopram (CELEXA) 20 MG tablet, Take 20 mg by mouth daily., Disp: , Rfl:    amLODipine (NORVASC) 5 MG tablet, Take 5 mg by mouth daily., Disp: , Rfl:    b complex vitamins capsule, Take 1 capsule by mouth daily., Disp: , Rfl:    bisoprolol (ZEBETA) 5 MG tablet, TAKE 1 TABLET BY MOUTH EVERY DAY, Disp: 90 tablet, Rfl: 0   buPROPion (WELLBUTRIN XL) 150 MG 24 hr tablet, Take 150 mg by mouth every morning., Disp: , Rfl:    Cholecalciferol (VITAMIN D3) 50 MCG (2000 UT) TABS, Take 50 mcg by mouth daily., Disp: , Rfl:    fenofibrate micronized (LOFIBRA) 134 MG capsule, Take 134 mg by mouth daily before breakfast. , Disp: , Rfl: 4   fluticasone (FLONASE) 50 MCG/ACT nasal spray, Place into both nostrils., Disp: , Rfl:    hydrOXYzine (ATARAX/VISTARIL) 10 MG tablet, 1 tablet, Disp: , Rfl:    mometasone-formoterol (DULERA) 100-5 MCG/ACT AERO, 2 puffs, Disp: , Rfl:    Multiple Vitamins-Minerals (MULTIVITAMIN ADULTS 50+ PO), Take 1 tablet by mouth  daily., Disp: , Rfl:    rosuvastatin (CRESTOR) 20 MG tablet, 1 tablet, Disp: , Rfl:    traZODone (DESYREL) 50 MG tablet, 1 tablet at bedtime., Disp: , Rfl:    Vedolizumab (ENTYVIO IV), Inject into the vein every 8 (eight) weeks. Started on 06/02/18, last dose 07/14/18., Disp: , Rfl:   Allergies:  Allergies  Allergen Reactions   Hydrocodone-Acetaminophen Itching   Other Other (See Comments)   Phenazopyridine Rash    Past Medical History, Surgical history, Social history, and Family History were reviewed and updated.  Review of Systems: Review of Systems  All other systems reviewed and are negative. Physical Exam:  vitals were not taken for this visit.   Wt Readings from Last 3 Encounters:  08/02/20 200 lb 9.6 oz (91 kg)  09/24/19 191 lb (86.6 kg)  09/14/18 198 lb (89.8 kg)     Physical Exam Vitals reviewed.  HENT:     Head: Normocephalic and atraumatic.  Eyes:     Pupils: Pupils are equal, round, and reactive to light.  Cardiovascular:     Rate and Rhythm: Normal rate and regular rhythm.     Heart sounds: Normal heart sounds.  Pulmonary:     Effort: Pulmonary effort is normal.     Breath sounds: Normal breath  sounds.  Abdominal:     General: Bowel sounds are normal.     Palpations: Abdomen is soft.  Musculoskeletal:        General: No tenderness or deformity. Normal range of motion.     Cervical back: Normal range of motion.  Lymphadenopathy:     Cervical: No cervical adenopathy.  Skin:    General: Skin is warm and dry.     Findings: No erythema or rash.  Neurological:     Mental Status: He is alert and oriented to person, place, and time.  Psychiatric:        Behavior: Behavior normal.        Thought Content: Thought content normal.        Judgment: Judgment normal.     Lab Results  Component Value Date   WBC 14.8 (H) 09/22/2020   HGB 16.6 09/22/2020   HCT 49.4 09/22/2020   MCV 93.7 09/22/2020   PLT 267 09/22/2020     Chemistry      Component Value  Date/Time   NA 139 09/24/2019 1415   K 4.6 09/24/2019 1415   CL 106 09/24/2019 1415   CO2 27 09/24/2019 1415   BUN 18 09/24/2019 1415   CREATININE 1.49 (H) 09/24/2019 1415      Component Value Date/Time   CALCIUM 9.3 09/24/2019 1415   ALKPHOS 48 09/24/2019 1415   AST 15 09/24/2019 1415   ALT 11 09/24/2019 1415   BILITOT 0.3 09/24/2019 1415      Impression and Plan: Mr. Bontempo is a 70 year old male. He has chronic leukocytosis.  I looked at his blood smear.  I do not see anything that looked suspicious.  He has good maturity of his white blood cells.  There are no blasts.  He has no nucleated red cells.  I forgot to mention that he still smoking a little bit.  I think this might be the etiology for the leukocytosis.  Going back for years ago, his white cells really not changed.  I really think that his leukocytosis is going to be relatively stable.  I think he will trend between a range of values.  I really think that we can get him back in 1 year as always.  His blood smear still looks okay. The white blood cells appear reactive. I do not see any immature myeloid cells.  I think that as long as he is smoking, he probably will have some degree of leukocytosis.  I think we can get him back in 1 year now.    Volanda Napoleon, MD 7/1/202210:23 AM

## 2020-10-25 DIAGNOSIS — K5000 Crohn's disease of small intestine without complications: Secondary | ICD-10-CM | POA: Diagnosis not present

## 2020-10-27 ENCOUNTER — Telehealth: Payer: Self-pay | Admitting: Acute Care

## 2020-10-27 NOTE — Telephone Encounter (Signed)
Patient sent email with labs SG please advise  Order Date: 10/25/2020   Collection Date: 10/25/2020 11:37:00   Received: 10/26/2020 05:40:07   Report: 10/26/2020  05:36:00   Requesting Physician:BEEKMAN, Jeneen Rinks   Ordering Physician: Leigh Aurora         Hepatic Function Panel (7) NAME          VALUE         REFERENCE RANGE    LAB F        Protein, Total         6.0     6.0-8.5 (g/dL)         BN F        Albumin       4.1     3.8-4.8 (g/dL)         BN F        Bilirubin, Total        0.3     0.0-1.2 (mg/dL)      BN F        Bilirubin, Direct      0.11   0.00-0.40 (mg/dL)  BN F        Alkaline Phosphatase      65      44-121 (IU/L)         BN F        AST (SGOT)          23      0-40 (IU/L)   BN F        ALT (SGPT) 20      0-44 (IU/L)   BN PERFORMING LAB: [facility], [place], Phone - [phone], Director - MDNagendra

## 2020-10-27 NOTE — Telephone Encounter (Signed)
This patient is seen for lung cancer screening. He really needs to ask the MD who ordered the labs to evaluate them for him as I have no idea why these labs were collected. ( I'm not sure of the context , or the differential diagnosis list the labs were drawn to evaluate). Thanks

## 2020-10-27 NOTE — Telephone Encounter (Signed)
Mychart message open on this closing this message

## 2020-12-06 DIAGNOSIS — K5000 Crohn's disease of small intestine without complications: Secondary | ICD-10-CM | POA: Diagnosis not present

## 2021-01-01 DIAGNOSIS — Z23 Encounter for immunization: Secondary | ICD-10-CM | POA: Diagnosis not present

## 2021-01-16 DIAGNOSIS — Z23 Encounter for immunization: Secondary | ICD-10-CM | POA: Diagnosis not present

## 2021-01-18 DIAGNOSIS — Z79899 Other long term (current) drug therapy: Secondary | ICD-10-CM | POA: Diagnosis not present

## 2021-01-18 DIAGNOSIS — K5000 Crohn's disease of small intestine without complications: Secondary | ICD-10-CM | POA: Diagnosis not present

## 2021-03-01 DIAGNOSIS — K5000 Crohn's disease of small intestine without complications: Secondary | ICD-10-CM | POA: Diagnosis not present

## 2021-03-12 DIAGNOSIS — K5000 Crohn's disease of small intestine without complications: Secondary | ICD-10-CM | POA: Diagnosis not present

## 2021-03-14 ENCOUNTER — Ambulatory Visit
Admission: RE | Admit: 2021-03-14 | Discharge: 2021-03-14 | Disposition: A | Discharge status: Home / Self Care | Payer: Federal, State, Local not specified - PPO | Source: Ambulatory Visit | Attending: Acute Care | Admitting: Acute Care

## 2021-03-14 DIAGNOSIS — F17210 Nicotine dependence, cigarettes, uncomplicated: Secondary | ICD-10-CM

## 2021-03-14 DIAGNOSIS — J439 Emphysema, unspecified: Secondary | ICD-10-CM | POA: Diagnosis not present

## 2021-03-14 DIAGNOSIS — Z87891 Personal history of nicotine dependence: Secondary | ICD-10-CM

## 2021-03-14 DIAGNOSIS — R911 Solitary pulmonary nodule: Secondary | ICD-10-CM | POA: Diagnosis not present

## 2021-03-14 DIAGNOSIS — I700 Atherosclerosis of aorta: Secondary | ICD-10-CM | POA: Diagnosis not present

## 2021-03-14 DIAGNOSIS — R918 Other nonspecific abnormal finding of lung field: Secondary | ICD-10-CM | POA: Diagnosis not present

## 2021-03-15 ENCOUNTER — Other Ambulatory Visit: Payer: Self-pay | Admitting: Acute Care

## 2021-03-15 ENCOUNTER — Telehealth: Payer: Self-pay | Admitting: Acute Care

## 2021-03-15 DIAGNOSIS — Z87891 Personal history of nicotine dependence: Secondary | ICD-10-CM

## 2021-03-15 DIAGNOSIS — F17210 Nicotine dependence, cigarettes, uncomplicated: Secondary | ICD-10-CM

## 2021-03-15 NOTE — Telephone Encounter (Signed)
Results of Chest CT given to patient.  No significant changes notes - stable nodules.  Repeat CT in 1 year.  Patient acknowledged understanding.  No further questions.

## 2021-03-20 DIAGNOSIS — M25511 Pain in right shoulder: Secondary | ICD-10-CM | POA: Diagnosis not present

## 2021-04-10 DIAGNOSIS — K5000 Crohn's disease of small intestine without complications: Secondary | ICD-10-CM | POA: Diagnosis not present

## 2021-04-12 DIAGNOSIS — K5000 Crohn's disease of small intestine without complications: Secondary | ICD-10-CM | POA: Diagnosis not present

## 2021-05-24 DIAGNOSIS — K5000 Crohn's disease of small intestine without complications: Secondary | ICD-10-CM | POA: Diagnosis not present

## 2021-06-05 DIAGNOSIS — Z125 Encounter for screening for malignant neoplasm of prostate: Secondary | ICD-10-CM | POA: Diagnosis not present

## 2021-06-05 DIAGNOSIS — E782 Mixed hyperlipidemia: Secondary | ICD-10-CM | POA: Diagnosis not present

## 2021-06-05 DIAGNOSIS — I10 Essential (primary) hypertension: Secondary | ICD-10-CM | POA: Diagnosis not present

## 2021-06-07 DIAGNOSIS — G4700 Insomnia, unspecified: Secondary | ICD-10-CM | POA: Diagnosis not present

## 2021-06-07 DIAGNOSIS — N183 Chronic kidney disease, stage 3 unspecified: Secondary | ICD-10-CM | POA: Diagnosis not present

## 2021-06-07 DIAGNOSIS — Z0000 Encounter for general adult medical examination without abnormal findings: Secondary | ICD-10-CM | POA: Diagnosis not present

## 2021-06-07 DIAGNOSIS — F411 Generalized anxiety disorder: Secondary | ICD-10-CM | POA: Diagnosis not present

## 2021-06-07 DIAGNOSIS — J309 Allergic rhinitis, unspecified: Secondary | ICD-10-CM | POA: Diagnosis not present

## 2021-06-07 DIAGNOSIS — M25511 Pain in right shoulder: Secondary | ICD-10-CM | POA: Diagnosis not present

## 2021-06-07 DIAGNOSIS — E782 Mixed hyperlipidemia: Secondary | ICD-10-CM | POA: Diagnosis not present

## 2021-06-07 DIAGNOSIS — J449 Chronic obstructive pulmonary disease, unspecified: Secondary | ICD-10-CM | POA: Diagnosis not present

## 2021-06-07 DIAGNOSIS — F324 Major depressive disorder, single episode, in partial remission: Secondary | ICD-10-CM | POA: Diagnosis not present

## 2021-06-07 DIAGNOSIS — I10 Essential (primary) hypertension: Secondary | ICD-10-CM | POA: Diagnosis not present

## 2021-06-07 DIAGNOSIS — Z125 Encounter for screening for malignant neoplasm of prostate: Secondary | ICD-10-CM | POA: Diagnosis not present

## 2021-06-18 DIAGNOSIS — M25511 Pain in right shoulder: Secondary | ICD-10-CM | POA: Diagnosis not present

## 2021-06-25 DIAGNOSIS — M25511 Pain in right shoulder: Secondary | ICD-10-CM | POA: Diagnosis not present

## 2021-07-02 DIAGNOSIS — M75111 Incomplete rotator cuff tear or rupture of right shoulder, not specified as traumatic: Secondary | ICD-10-CM | POA: Diagnosis not present

## 2021-07-05 DIAGNOSIS — Z79899 Other long term (current) drug therapy: Secondary | ICD-10-CM | POA: Diagnosis not present

## 2021-07-05 DIAGNOSIS — Z111 Encounter for screening for respiratory tuberculosis: Secondary | ICD-10-CM | POA: Diagnosis not present

## 2021-07-05 DIAGNOSIS — K5000 Crohn's disease of small intestine without complications: Secondary | ICD-10-CM | POA: Diagnosis not present

## 2021-07-05 DIAGNOSIS — R5383 Other fatigue: Secondary | ICD-10-CM | POA: Diagnosis not present

## 2021-07-11 DIAGNOSIS — M75111 Incomplete rotator cuff tear or rupture of right shoulder, not specified as traumatic: Secondary | ICD-10-CM | POA: Diagnosis not present

## 2021-07-11 DIAGNOSIS — M6281 Muscle weakness (generalized): Secondary | ICD-10-CM | POA: Diagnosis not present

## 2021-07-27 DIAGNOSIS — M75111 Incomplete rotator cuff tear or rupture of right shoulder, not specified as traumatic: Secondary | ICD-10-CM | POA: Diagnosis not present

## 2021-07-27 DIAGNOSIS — M6281 Muscle weakness (generalized): Secondary | ICD-10-CM | POA: Diagnosis not present

## 2021-08-03 DIAGNOSIS — M6281 Muscle weakness (generalized): Secondary | ICD-10-CM | POA: Diagnosis not present

## 2021-08-03 DIAGNOSIS — M75111 Incomplete rotator cuff tear or rupture of right shoulder, not specified as traumatic: Secondary | ICD-10-CM | POA: Diagnosis not present

## 2021-08-08 DIAGNOSIS — C652 Malignant neoplasm of left renal pelvis: Secondary | ICD-10-CM | POA: Diagnosis not present

## 2021-08-10 DIAGNOSIS — M6281 Muscle weakness (generalized): Secondary | ICD-10-CM | POA: Diagnosis not present

## 2021-08-10 DIAGNOSIS — M75111 Incomplete rotator cuff tear or rupture of right shoulder, not specified as traumatic: Secondary | ICD-10-CM | POA: Diagnosis not present

## 2021-08-13 DIAGNOSIS — M25511 Pain in right shoulder: Secondary | ICD-10-CM | POA: Diagnosis not present

## 2021-08-15 DIAGNOSIS — C652 Malignant neoplasm of left renal pelvis: Secondary | ICD-10-CM | POA: Diagnosis not present

## 2021-08-15 DIAGNOSIS — N289 Disorder of kidney and ureter, unspecified: Secondary | ICD-10-CM | POA: Diagnosis not present

## 2021-08-15 DIAGNOSIS — K6389 Other specified diseases of intestine: Secondary | ICD-10-CM | POA: Diagnosis not present

## 2021-08-16 DIAGNOSIS — K5000 Crohn's disease of small intestine without complications: Secondary | ICD-10-CM | POA: Diagnosis not present

## 2021-08-17 DIAGNOSIS — M75111 Incomplete rotator cuff tear or rupture of right shoulder, not specified as traumatic: Secondary | ICD-10-CM | POA: Diagnosis not present

## 2021-08-17 DIAGNOSIS — M6281 Muscle weakness (generalized): Secondary | ICD-10-CM | POA: Diagnosis not present

## 2021-08-23 DIAGNOSIS — D4122 Neoplasm of uncertain behavior of left ureter: Secondary | ICD-10-CM | POA: Diagnosis not present

## 2021-08-24 DIAGNOSIS — M75111 Incomplete rotator cuff tear or rupture of right shoulder, not specified as traumatic: Secondary | ICD-10-CM | POA: Diagnosis not present

## 2021-08-24 DIAGNOSIS — M6281 Muscle weakness (generalized): Secondary | ICD-10-CM | POA: Diagnosis not present

## 2021-09-04 DIAGNOSIS — M75111 Incomplete rotator cuff tear or rupture of right shoulder, not specified as traumatic: Secondary | ICD-10-CM | POA: Diagnosis not present

## 2021-09-04 DIAGNOSIS — M6281 Muscle weakness (generalized): Secondary | ICD-10-CM | POA: Diagnosis not present

## 2021-09-24 ENCOUNTER — Inpatient Hospital Stay (HOSPITAL_BASED_OUTPATIENT_CLINIC_OR_DEPARTMENT_OTHER): Payer: Medicare Other | Admitting: Hematology & Oncology

## 2021-09-24 ENCOUNTER — Encounter: Payer: Self-pay | Admitting: Hematology & Oncology

## 2021-09-24 ENCOUNTER — Other Ambulatory Visit: Payer: Self-pay

## 2021-09-24 ENCOUNTER — Inpatient Hospital Stay: Payer: Medicare Other | Attending: Hematology & Oncology

## 2021-09-24 VITALS — BP 122/58 | HR 50 | Temp 97.9°F | Resp 18 | Ht 72.84 in | Wt 196.8 lb

## 2021-09-24 DIAGNOSIS — D72823 Leukemoid reaction: Secondary | ICD-10-CM

## 2021-09-24 DIAGNOSIS — D72829 Elevated white blood cell count, unspecified: Secondary | ICD-10-CM | POA: Insufficient documentation

## 2021-09-24 DIAGNOSIS — K5090 Crohn's disease, unspecified, without complications: Secondary | ICD-10-CM | POA: Diagnosis not present

## 2021-09-24 DIAGNOSIS — Z79899 Other long term (current) drug therapy: Secondary | ICD-10-CM | POA: Diagnosis not present

## 2021-09-24 DIAGNOSIS — F17200 Nicotine dependence, unspecified, uncomplicated: Secondary | ICD-10-CM | POA: Insufficient documentation

## 2021-09-24 LAB — CBC WITH DIFFERENTIAL (CANCER CENTER ONLY)
Abs Immature Granulocytes: 0.05 10*3/uL (ref 0.00–0.07)
Basophils Absolute: 0.1 10*3/uL (ref 0.0–0.1)
Basophils Relative: 1 %
Eosinophils Absolute: 0.4 10*3/uL (ref 0.0–0.5)
Eosinophils Relative: 3 %
HCT: 50.3 % (ref 39.0–52.0)
Hemoglobin: 16.7 g/dL (ref 13.0–17.0)
Immature Granulocytes: 0 %
Lymphocytes Relative: 16 %
Lymphs Abs: 1.9 10*3/uL (ref 0.7–4.0)
MCH: 31.5 pg (ref 26.0–34.0)
MCHC: 33.2 g/dL (ref 30.0–36.0)
MCV: 94.9 fL (ref 80.0–100.0)
Monocytes Absolute: 0.8 10*3/uL (ref 0.1–1.0)
Monocytes Relative: 7 %
Neutro Abs: 8.7 10*3/uL — ABNORMAL HIGH (ref 1.7–7.7)
Neutrophils Relative %: 73 %
Platelet Count: 302 10*3/uL (ref 150–400)
RBC: 5.3 MIL/uL (ref 4.22–5.81)
RDW: 13 % (ref 11.5–15.5)
WBC Count: 12.1 10*3/uL — ABNORMAL HIGH (ref 4.0–10.5)
nRBC: 0 % (ref 0.0–0.2)

## 2021-09-24 LAB — CMP (CANCER CENTER ONLY)
ALT: 20 U/L (ref 0–44)
AST: 18 U/L (ref 15–41)
Albumin: 4.3 g/dL (ref 3.5–5.0)
Alkaline Phosphatase: 58 U/L (ref 38–126)
Anion gap: 5 (ref 5–15)
BUN: 13 mg/dL (ref 8–23)
CO2: 28 mmol/L (ref 22–32)
Calcium: 9.7 mg/dL (ref 8.9–10.3)
Chloride: 108 mmol/L (ref 98–111)
Creatinine: 1.04 mg/dL (ref 0.61–1.24)
GFR, Estimated: >60 mL/min (ref >60)
Glucose, Bld: 113 mg/dL — ABNORMAL HIGH (ref 70–99)
Potassium: 4.5 mmol/L (ref 3.5–5.1)
Sodium: 141 mmol/L (ref 135–145)
Total Bilirubin: 0.4 mg/dL (ref 0.3–1.2)
Total Protein: 6.8 g/dL (ref 6.5–8.1)

## 2021-09-24 LAB — LACTATE DEHYDROGENASE: LDH: 120 U/L (ref 98–192)

## 2021-09-24 LAB — SAVE SMEAR(SSMR), FOR PROVIDER SLIDE REVIEW

## 2021-09-24 NOTE — Progress Notes (Signed)
Hematology and Oncology Follow Up Visit  Zachary Oneal [id number] May 26, 1950 71 y.o. 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 is back for follow-up.  We see him every year.  Since we last saw him, he is been doing okay.  Back in November, he fell out of a tree.  Thankfully, he did not break anything.  I think he tore the rotator cuff in the right shoulder.  He did not have any surgery for this.  He does have Crohn's disease.  He is on Entyvio for this.  So far, there is been no flareups.  He has had no issues with rashes.  He has had no cough or shortness of breath.  He is still smoking.  He has had no leg swelling.  Overall, I was his performance status is probably ECOG 0.    Medications:  Current Outpatient Medications:    amLODipine (NORVASC) 5 MG tablet, Take 5 mg by mouth daily., Disp: , Rfl:    b complex vitamins capsule, Take 1 capsule by mouth daily., Disp: , Rfl:    bisoprolol (ZEBETA) 5 MG tablet, TAKE 1 TABLET BY MOUTH EVERY DAY, Disp: 90 tablet, Rfl: 0   buPROPion (WELLBUTRIN XL) 150 MG 24 hr tablet, Take 150 mg by mouth every morning., Disp: , Rfl:    Cholecalciferol (VITAMIN D3) 50 MCG (2000 UT) TABS, Take 50 mcg by mouth daily., Disp: , Rfl:    citalopram (CELEXA) 20 MG tablet, Take 20 mg by mouth daily., Disp: , Rfl:    fenofibrate micronized (LOFIBRA) 134 MG capsule, Take 134 mg by mouth daily before breakfast. , Disp: , Rfl: 4   fluticasone (FLONASE) 50 MCG/ACT nasal spray, Place into both nostrils., Disp: , Rfl:    hydrOXYzine (ATARAX/VISTARIL) 10 MG tablet, 1 tablet, Disp: , Rfl:    mometasone-formoterol (DULERA) 100-5 MCG/ACT AERO, 2 puffs, Disp: , Rfl:    Multiple Vitamins-Minerals (MULTIVITAMIN ADULTS 50+ PO), Take 1 tablet by mouth daily., Disp: , Rfl:    rosuvastatin (CRESTOR) 20 MG tablet, 1 tablet,  Disp: , Rfl:    Vedolizumab (ENTYVIO IV), Inject into the vein every 8 (eight) weeks. Started on 06/02/18, last dose 07/14/18., Disp: , Rfl:   Allergies:  Allergies  Allergen Reactions   Hydrocodone-Acetaminophen Itching   Other Other (See Comments)   Phenazopyridine Rash    Past Medical History, Surgical history, Social history, and Family History were reviewed and updated.  Review of Systems: Review of Systems  All other systems reviewed and are negative.  Physical Exam:  height is 6' 0.84" (1.85 m) and weight is 196 lb 12.8 oz (89.3 kg). His oral temperature is 97.9 F (36.6 C). His blood pressure is 122/58 (abnormal) and his pulse is 50 (abnormal). His respiration is 18 and oxygen saturation is 96%.   Wt Readings from Last 3 Encounters:  09/24/21 196 lb 12.8 oz (89.3 kg)  08/02/20 200 lb 9.6 oz (91 kg)  09/24/19 191 lb (86.6 kg)     Physical Exam Vitals reviewed.  HENT:     Head: Normocephalic and atraumatic.  Eyes:     Pupils: Pupils are equal, round, and reactive to light.  Cardiovascular:     Rate and Rhythm: Normal rate and regular rhythm.     Heart sounds: Normal heart sounds.  Pulmonary:     Effort: Pulmonary effort  is normal.     Breath sounds: Normal breath sounds.  Abdominal:     General: Bowel sounds are normal.     Palpations: Abdomen is soft.  Musculoskeletal:        General: No tenderness or deformity. Normal range of motion.     Cervical back: Normal range of motion.  Lymphadenopathy:     Cervical: No cervical adenopathy.  Skin:    General: Skin is warm and dry.     Findings: No erythema or rash.  Neurological:     Mental Status: He is alert and oriented to person, place, and time.  Psychiatric:        Behavior: Behavior normal.        Thought Content: Thought content normal.        Judgment: Judgment normal.      Lab Results  Component Value Date   WBC 12.1 (H) 09/24/2021   HGB 16.7 09/24/2021   HCT 50.3 09/24/2021   MCV 94.9  09/24/2021   PLT 302 09/24/2021     Chemistry      Component Value Date/Time   NA 141 09/24/2021 1004   K 4.5 09/24/2021 1004   CL 108 09/24/2021 1004   CO2 28 09/24/2021 1004   BUN 13 09/24/2021 1004   CREATININE 1.04 09/24/2021 1004      Component Value Date/Time   CALCIUM 9.7 09/24/2021 1004   ALKPHOS 58 09/24/2021 1004   AST 18 09/24/2021 1004   ALT 20 09/24/2021 1004   BILITOT 0.4 09/24/2021 1004      Impression and Plan: Zachary Oneal is a 71 year old male. He has chronic leukocytosis.  I looked at his blood smear.  I do not see anything that looked suspicious.  He has good maturity of his white blood cells.  There are no blasts.  He has no nucleated red cells.  Everything looks fine.  His blood count is holding steady.  He like to come back to see [place] yearly.  I think this would still be very reasonable.  I really do not see that this leukocytosis is going to lead to any issue with respect to myeloproliferative disorder or an acute leukemia.   Zachary Napoleon, MD 7/3/202310:51 AM

## 2021-09-28 DIAGNOSIS — K5000 Crohn's disease of small intestine without complications: Secondary | ICD-10-CM | POA: Diagnosis not present

## 2021-10-10 DIAGNOSIS — K5000 Crohn's disease of small intestine without complications: Secondary | ICD-10-CM | POA: Diagnosis not present

## 2021-11-12 DIAGNOSIS — K5000 Crohn's disease of small intestine without complications: Secondary | ICD-10-CM | POA: Diagnosis not present

## 2021-11-12 DIAGNOSIS — Z79899 Other long term (current) drug therapy: Secondary | ICD-10-CM | POA: Diagnosis not present

## 2021-12-21 DIAGNOSIS — Z23 Encounter for immunization: Secondary | ICD-10-CM | POA: Diagnosis not present

## 2021-12-23 DIAGNOSIS — Z23 Encounter for immunization: Secondary | ICD-10-CM | POA: Diagnosis not present

## 2021-12-24 DIAGNOSIS — Z79899 Other long term (current) drug therapy: Secondary | ICD-10-CM | POA: Diagnosis not present

## 2021-12-24 DIAGNOSIS — K5000 Crohn's disease of small intestine without complications: Secondary | ICD-10-CM | POA: Diagnosis not present

## 2022-01-14 DIAGNOSIS — R899 Unspecified abnormal finding in specimens from other organs, systems and tissues: Secondary | ICD-10-CM | POA: Diagnosis not present

## 2022-02-04 DIAGNOSIS — K5000 Crohn's disease of small intestine without complications: Secondary | ICD-10-CM | POA: Diagnosis not present

## 2022-03-12 DIAGNOSIS — K1370 Unspecified lesions of oral mucosa: Secondary | ICD-10-CM | POA: Diagnosis not present

## 2022-03-14 ENCOUNTER — Ambulatory Visit
Admission: RE | Admit: 2022-03-14 | Discharge: 2022-03-14 | Disposition: A | Discharge status: Home / Self Care | Payer: Federal, State, Local not specified - PPO | Source: Ambulatory Visit | Attending: Acute Care | Admitting: Acute Care

## 2022-03-14 DIAGNOSIS — F17210 Nicotine dependence, cigarettes, uncomplicated: Secondary | ICD-10-CM

## 2022-03-14 DIAGNOSIS — Z122 Encounter for screening for malignant neoplasm of respiratory organs: Secondary | ICD-10-CM | POA: Diagnosis not present

## 2022-03-14 DIAGNOSIS — Z87891 Personal history of nicotine dependence: Secondary | ICD-10-CM

## 2022-03-20 ENCOUNTER — Other Ambulatory Visit: Payer: Self-pay

## 2022-03-20 DIAGNOSIS — Z87891 Personal history of nicotine dependence: Secondary | ICD-10-CM

## 2022-03-20 DIAGNOSIS — K5000 Crohn's disease of small intestine without complications: Secondary | ICD-10-CM | POA: Diagnosis not present

## 2022-03-20 DIAGNOSIS — F17210 Nicotine dependence, cigarettes, uncomplicated: Secondary | ICD-10-CM

## 2022-03-29 DIAGNOSIS — D101 Benign neoplasm of tongue: Secondary | ICD-10-CM | POA: Diagnosis not present

## 2022-04-01 ENCOUNTER — Ambulatory Visit: Payer: Self-pay | Admitting: Internal Medicine

## 2022-04-01 DIAGNOSIS — I2510 Atherosclerotic heart disease of native coronary artery without angina pectoris: Secondary | ICD-10-CM

## 2022-04-05 ENCOUNTER — Ambulatory Visit: Payer: Medicare Other | Admitting: Internal Medicine

## 2022-04-08 ENCOUNTER — Encounter: Payer: Self-pay | Admitting: Internal Medicine

## 2022-04-08 ENCOUNTER — Ambulatory Visit: Payer: Medicare Other | Admitting: Internal Medicine

## 2022-04-08 VITALS — BP 142/62 | HR 52 | Ht 72.0 in | Wt 204.0 lb

## 2022-04-08 DIAGNOSIS — E782 Mixed hyperlipidemia: Secondary | ICD-10-CM

## 2022-04-08 DIAGNOSIS — I10 Essential (primary) hypertension: Secondary | ICD-10-CM

## 2022-04-08 DIAGNOSIS — I2510 Atherosclerotic heart disease of native coronary artery without angina pectoris: Secondary | ICD-10-CM

## 2022-04-08 NOTE — Progress Notes (Signed)
Primary Physician/Referring:  Lawerance Cruel, MD  Patient ID: Zachary Oneal, male    DOB: 29-Jun-1950, 72 y.o.   MRN: [id number]  Chief Complaint  Patient presents with   Coronary Artery Disease   New Patient (Initial Visit)   HPI:    Kadarius Cuffe  is a 72 y.o. male with past medical history significant for hypertension, hyperlipidemia, and COPD who is here to establish care with cardiology.  Patient recently had a low-dose CT scan for lung cancer screening and was noted to have significant coronary artery calcifications.  Upon further review patient admits that he has slowed down lately and he attributes this to getting old.  He does have dyspnea with excess exertion however he thinks this is from his COPD.  Patient denies chest pain, palpitations, diaphoresis, syncope, edema, orthopnea, claudication, PND.  Past Medical History:  Diagnosis Date   Hypertension    Ureteral mass    Wears glasses    Past Surgical History:  Procedure Laterality Date   CYSTOSCOPY WITH RETROGRADE PYELOGRAM, URETEROSCOPY AND STENT PLACEMENT Bilateral 12/06/2015   Procedure: CYSTOSCOPY WITH BILATERAL RETROGRADE PYELOGRAM, LEFT URETERAL BALLOON DILITATION, LEFT URETEROSCOPY AND LEFT URETERAL BIOPSY AND LEFT URETERAL STENT PLACEMENT;  Surgeon: Ardis Hughs, MD;  Location: [facility];  Service: Urology;  Laterality: Bilateral;   WISDOM TOOTH EXTRACTION     Family History  Problem Relation Age of Onset   Cancer Mother        unknown type   Pancreatic cancer Father     Social History   Tobacco Use   Smoking status: Every Day    Packs/day: 1.00    Years: 20.00    Total pack years: 20.00    Types: Cigarettes   Smokeless tobacco: Never  Substance Use Topics   Alcohol use: Yes    Alcohol/week: 0.0 standard drinks of alcohol    Comment: socially   Marital Status: Married  ROS  Review of Systems  Constitutional: Positive for malaise/fatigue.  Cardiovascular:  Positive for  dyspnea on exertion.   Objective  Blood pressure (!) 142/62, pulse (!) 52, height 6' (1.829 m), weight 204 lb (92.5 kg), SpO2 95 %. Body mass index is 27.67 kg/m.     04/08/2022   12:41 PM 09/24/2021   10:26 AM 08/02/2020   10:09 AM  Vitals with BMI  Height '6\' 0"'$  6' 0.835" '6\' 1"'$   Weight 204 lbs 196 lbs 13 oz 200 lbs 10 oz  BMI 27.66 84.66 59.93  Systolic 570 177 939  Diastolic 62 58 60  Pulse 52 50 56     Physical Exam Vitals reviewed.  HENT:     Head: Normocephalic and atraumatic.  Neck:     Vascular: No carotid bruit.  Cardiovascular:     Rate and Rhythm: Regular rhythm. Bradycardia present.     Pulses: Normal pulses.     Heart sounds: Normal heart sounds. No murmur heard. Pulmonary:     Effort: Pulmonary effort is normal.     Breath sounds: Normal breath sounds.  Abdominal:     General: Bowel sounds are normal.  Musculoskeletal:     Right lower leg: No edema.     Left lower leg: No edema.  Skin:    General: Skin is warm and dry.  Neurological:     Mental Status: He is alert.     Medications and allergies   Allergies  Allergen Reactions   Hydrocodone Itching   Phenazopyridine Hcl Itching  Medication list after today's encounter   Current Outpatient Medications:    amLODipine (NORVASC) 5 MG tablet, Take 5 mg by mouth daily., Disp: , Rfl:    B Complex Vitamins (VITAMIN B COMPLEX) CAPS, Vitamin B Complex, Disp: , Rfl:    bisoprolol (ZEBETA) 5 MG tablet, TAKE 1 TABLET BY MOUTH EVERY DAY, Disp: 90 tablet, Rfl: 0   Cholecalciferol (VITAMIN D3) 50 MCG (2000 UT) TABS, Take 50 mcg by mouth daily., Disp: , Rfl:    citalopram (CELEXA) 20 MG tablet, Take 20 mg by mouth daily., Disp: , Rfl:    fluticasone (FLONASE) 50 MCG/ACT nasal spray, Place into both nostrils., Disp: , Rfl:    hydrOXYzine (ATARAX/VISTARIL) 10 MG tablet, 1 tablet, Disp: , Rfl:    mometasone-formoterol (DULERA) 100-5 MCG/ACT AERO, 2 puffs, Disp: , Rfl:    Multiple Vitamins-Minerals (MULTIVITAMIN  ADULTS 50+ PO), Take 1 tablet by mouth daily., Disp: , Rfl:    rosuvastatin (CRESTOR) 20 MG tablet, 1 tablet, Disp: , Rfl:    Vedolizumab (ENTYVIO IV), Inject into the vein every 8 (eight) weeks. Started on 06/02/18, last dose 07/14/18., Disp: , Rfl:   Laboratory examination:   Lab Results  Component Value Date   NA 141 09/24/2021   K 4.5 09/24/2021   CO2 28 09/24/2021   GLUCOSE 113 (H) 09/24/2021   BUN 13 09/24/2021   CREATININE 1.04 09/24/2021   CALCIUM 9.7 09/24/2021   GFRNONAA >60 09/24/2021       Latest Ref Rng & Units 09/24/2021   10:04 AM 09/22/2020    9:55 AM 09/24/2019    2:15 PM  CMP  Glucose 70 - 99 mg/dL 113  108  125   BUN 8 - 23 mg/dL '13  11  18   '$ Creatinine 0.61 - 1.24 mg/dL 1.04  0.94  1.49   Sodium 135 - 145 mmol/L 141  138  139   Potassium 3.5 - 5.1 mmol/L 4.5  4.1  4.6   Chloride 98 - 111 mmol/L 108  102  106   CO2 22 - 32 mmol/L '28  29  27   '$ Calcium 8.9 - 10.3 mg/dL 9.7  9.8  9.3   Total Protein 6.5 - 8.1 g/dL 6.8  6.7  6.7   Total Bilirubin 0.3 - 1.2 mg/dL 0.4  0.4  0.3   Alkaline Phos 38 - 126 U/L 58  57  48   AST 15 - 41 U/L '18  21  15   '$ ALT 0 - 44 U/L '20  26  11       '$ Latest Ref Rng & Units 09/24/2021   10:04 AM 09/22/2020    9:55 AM 09/24/2019    2:15 PM  CBC  WBC 4.0 - 10.5 K/uL 12.1  14.8  13.8   Hemoglobin 13.0 - 17.0 g/dL 16.7  16.6  15.3   Hematocrit 39.0 - 52.0 % 50.3  49.4  45.8   Platelets 150 - 400 K/uL 302  267  335     Lipid Panel No results for input(s): "CHOL", "TRIG", "LDLCALC", "VLDL", "HDL", "CHOLHDL", "LDLDIRECT" in the last 8760 hours.  HEMOGLOBIN A1C No results found for: "HGBA1C", "MPG" TSH No results for input(s): "TSH" in the last 8760 hours.  External labs:     Radiology:    Cardiac Studies:   LDCT for lung cancer screening: FINDINGS: Cardiovascular: Normal heart size. No pericardial effusion. Normal caliber thoracic aorta with moderate atherosclerotic disease. Severe coronary artery calcifications. Mitral annular  calcifications.  EKG:   04/08/2022: sinus bradycardia with variable conduction. No evidence of ischemia  Assessment     ICD-10-CM   1. Essential hypertension  I10 PCV ECHOCARDIOGRAM COMPLETE    2. Coronary artery calcification seen on CT scan  I25.10 PCV ECHOCARDIOGRAM COMPLETE    PCV MYOCARDIAL PERFUSION WO LEXISCAN    3. Mixed hyperlipidemia  E78.2        Orders Placed This Encounter  Procedures   PCV MYOCARDIAL PERFUSION WO LEXISCAN    Standing Status:   Future    Standing Expiration Date:   06/07/2022   PCV ECHOCARDIOGRAM COMPLETE    Standing Status:   Future    Standing Expiration Date:   04/09/2023    No orders of the defined types were placed in this encounter.   Medications Discontinued During This Encounter  Medication Reason   b complex vitamins capsule Completed Course   buPROPion (WELLBUTRIN XL) 150 MG 24 hr tablet    fenofibrate micronized (LOFIBRA) 134 MG capsule      Recommendations:   Zacharias Ridling is a 72 y.o.  male with coronary artery calcification  Essential hypertension Continue current cardiac medications. BP slightly elevated today, will hold off on adding new medications until next visit and patient should keep BP log Encourage low-sodium diet, less than 2000 mg daily. Follow-up in 1-2 months or sooner if needed.   Coronary artery calcification seen on CT scan Seen when patient had LDCT for lung cancer screening Will obtain echo and stress test Restart baby aspirin 81 mg daily   Mixed hyperlipidemia Continue Crestor 20 mg Primary following lipids Patient states they have been well controlled     Floydene Flock, DO, [facility]  04/08/2022, 1:21 PM Office: [phone] Pager: [phone]

## 2022-04-11 ENCOUNTER — Ambulatory Visit: Payer: Medicare Other

## 2022-04-11 DIAGNOSIS — I10 Essential (primary) hypertension: Secondary | ICD-10-CM | POA: Diagnosis not present

## 2022-04-11 DIAGNOSIS — I2510 Atherosclerotic heart disease of native coronary artery without angina pectoris: Secondary | ICD-10-CM

## 2022-04-15 NOTE — Progress Notes (Signed)
Called patient no answer, left a vm

## 2022-04-15 NOTE — Progress Notes (Signed)
No show

## 2022-04-15 NOTE — Progress Notes (Signed)
normal

## 2022-04-17 NOTE — Progress Notes (Signed)
LMTCB

## 2022-04-17 NOTE — Progress Notes (Signed)
Gave pt results, he acknowledged understanding and had no further questions.

## 2022-04-22 ENCOUNTER — Ambulatory Visit: Payer: Medicare Other

## 2022-04-22 DIAGNOSIS — I2510 Atherosclerotic heart disease of native coronary artery without angina pectoris: Secondary | ICD-10-CM

## 2022-04-26 NOTE — Progress Notes (Signed)
Gave patient results, he acknowledged information and had no further questions.

## 2022-04-26 NOTE — Progress Notes (Signed)
normal

## 2022-05-01 DIAGNOSIS — K5000 Crohn's disease of small intestine without complications: Secondary | ICD-10-CM | POA: Diagnosis not present

## 2022-05-20 ENCOUNTER — Encounter: Payer: Self-pay | Admitting: Internal Medicine

## 2022-05-20 ENCOUNTER — Ambulatory Visit: Payer: Medicare Other | Admitting: Internal Medicine

## 2022-05-20 VITALS — BP 131/61 | HR 57 | Ht 72.0 in | Wt 209.6 lb

## 2022-05-20 DIAGNOSIS — I10 Essential (primary) hypertension: Secondary | ICD-10-CM

## 2022-05-20 DIAGNOSIS — I2510 Atherosclerotic heart disease of native coronary artery without angina pectoris: Secondary | ICD-10-CM | POA: Diagnosis not present

## 2022-05-20 DIAGNOSIS — E782 Mixed hyperlipidemia: Secondary | ICD-10-CM

## 2022-05-20 NOTE — Progress Notes (Signed)
Primary Physician/Referring:  Lawerance Cruel, MD  Patient ID: Zachary Oneal, male    DOB: 06-03-1950, 72 y.o.   MRN: [id number]  Chief Complaint  Patient presents with   Hypertension   Follow-up   Results   HPI:    Zachary Oneal  is a 72 y.o. male with past medical history significant for hypertension, hyperlipidemia, and COPD who is here for a follow-up visit. He has been doing well since the last time he was here. No concerns or complaints today. He is glad his testing came back normal. Patient denies chest pain, palpitations, diaphoresis, syncope, edema, orthopnea, claudication, PND.  Past Medical History:  Diagnosis Date   Hypertension    Ureteral mass    Wears glasses    Past Surgical History:  Procedure Laterality Date   CYSTOSCOPY WITH RETROGRADE PYELOGRAM, URETEROSCOPY AND STENT PLACEMENT Bilateral 12/06/2015   Procedure: CYSTOSCOPY WITH BILATERAL RETROGRADE PYELOGRAM, LEFT URETERAL BALLOON DILITATION, LEFT URETEROSCOPY AND LEFT URETERAL BIOPSY AND LEFT URETERAL STENT PLACEMENT;  Surgeon: Ardis Hughs, MD;  Location: [facility];  Service: Urology;  Laterality: Bilateral;   WISDOM TOOTH EXTRACTION     Family History  Problem Relation Age of Onset   Cancer Mother        unknown type   Pancreatic cancer Father     Social History   Tobacco Use   Smoking status: Every Day    Packs/day: 1.00    Years: 20.00    Total pack years: 20.00    Types: Cigarettes   Smokeless tobacco: Never  Substance Use Topics   Alcohol use: Yes    Alcohol/week: 0.0 standard drinks of alcohol    Comment: socially   Marital Status: Married  ROS  Review of Systems  Constitutional: Negative for malaise/fatigue.  Cardiovascular:  Negative for dyspnea on exertion.   Objective  Blood pressure 131/61, pulse (!) 57, height 6' (1.829 m), weight 209 lb 9.6 oz (95.1 kg), SpO2 95 %. Body mass index is 28.43 kg/m.     05/20/2022   11:03 AM 04/08/2022   12:41 PM  09/24/2021   10:26 AM  Vitals with BMI  Height '6\' 0"'$  '6\' 0"'$  6' 0.835"  Weight 209 lbs 10 oz 204 lbs 196 lbs 13 oz  BMI 28.42 [id number]  Systolic [id number]  Diastolic 61 62 58  Pulse 57 52 50     Physical Exam Vitals reviewed.  HENT:     Head: Normocephalic and atraumatic.  Neck:     Vascular: No carotid bruit.  Cardiovascular:     Rate and Rhythm: Regular rhythm. Bradycardia present.     Pulses: Normal pulses.     Heart sounds: Normal heart sounds. No murmur heard. Pulmonary:     Effort: Pulmonary effort is normal.     Breath sounds: Normal breath sounds.  Abdominal:     General: Bowel sounds are normal.  Musculoskeletal:     Right lower leg: No edema.     Left lower leg: No edema.  Skin:    General: Skin is warm and dry.  Neurological:     Mental Status: He is alert.     Medications and allergies   Allergies  Allergen Reactions   Hydrocodone Itching   Phenazopyridine Hcl Itching     Medication list after today's encounter   Current Outpatient Medications:    amLODipine (NORVASC) 5 MG tablet, Take 5 mg by mouth daily., Disp: , Rfl:    B Complex  Vitamins (VITAMIN B COMPLEX) CAPS, Vitamin B Complex, Disp: , Rfl:    bisoprolol (ZEBETA) 5 MG tablet, TAKE 1 TABLET BY MOUTH EVERY DAY, Disp: 90 tablet, Rfl: 0   Cholecalciferol (VITAMIN D3) 50 MCG (2000 UT) TABS, Take 50 mcg by mouth daily., Disp: , Rfl:    citalopram (CELEXA) 20 MG tablet, Take 20 mg by mouth daily., Disp: , Rfl:    fluticasone (FLONASE) 50 MCG/ACT nasal spray, Place into both nostrils., Disp: , Rfl:    hydrOXYzine (ATARAX/VISTARIL) 10 MG tablet, 1 tablet, Disp: , Rfl:    mometasone-formoterol (DULERA) 100-5 MCG/ACT AERO, 2 puffs, Disp: , Rfl:    Multiple Vitamins-Minerals (MULTIVITAMIN ADULTS 50+ PO), Take 1 tablet by mouth daily., Disp: , Rfl:    rosuvastatin (CRESTOR) 20 MG tablet, 1 tablet, Disp: , Rfl:    Vedolizumab (ENTYVIO IV), Inject into the vein every 8 (eight) weeks. Started on 06/02/18,  last dose 07/14/18., Disp: , Rfl:   Laboratory examination:   Lab Results  Component Value Date   NA 141 09/24/2021   K 4.5 09/24/2021   CO2 28 09/24/2021   GLUCOSE 113 (H) 09/24/2021   BUN 13 09/24/2021   CREATININE 1.04 09/24/2021   CALCIUM 9.7 09/24/2021   GFRNONAA >60 09/24/2021       Latest Ref Rng & Units 09/24/2021   10:04 AM 09/22/2020    9:55 AM 09/24/2019    2:15 PM  CMP  Glucose 70 - 99 mg/dL 113  108  125   BUN 8 - 23 mg/dL '13  11  18   '$ Creatinine 0.61 - 1.24 mg/dL 1.04  0.94  1.49   Sodium 135 - 145 mmol/L 141  138  139   Potassium 3.5 - 5.1 mmol/L 4.5  4.1  4.6   Chloride 98 - 111 mmol/L 108  102  106   CO2 22 - 32 mmol/L '28  29  27   '$ Calcium 8.9 - 10.3 mg/dL 9.7  9.8  9.3   Total Protein 6.5 - 8.1 g/dL 6.8  6.7  6.7   Total Bilirubin 0.3 - 1.2 mg/dL 0.4  0.4  0.3   Alkaline Phos 38 - 126 U/L 58  57  48   AST 15 - 41 U/L '18  21  15   '$ ALT 0 - 44 U/L '20  26  11       '$ Latest Ref Rng & Units 09/24/2021   10:04 AM 09/22/2020    9:55 AM 09/24/2019    2:15 PM  CBC  WBC 4.0 - 10.5 K/uL 12.1  14.8  13.8   Hemoglobin 13.0 - 17.0 g/dL 16.7  16.6  15.3   Hematocrit 39.0 - 52.0 % 50.3  49.4  45.8   Platelets 150 - 400 K/uL 302  267  335     Lipid Panel No results for input(s): "CHOL", "TRIG", "LDLCALC", "VLDL", "HDL", "CHOLHDL", "LDLDIRECT" in the last 8760 hours.  HEMOGLOBIN A1C No results found for: "HGBA1C", "MPG" TSH No results for input(s): "TSH" in the last 8760 hours.  External labs:     Radiology:    Cardiac Studies:   LDCT for lung cancer screening: FINDINGS: Cardiovascular: Normal heart size. No pericardial effusion. Normal caliber thoracic aorta with moderate atherosclerotic disease. Severe coronary artery calcifications. Mitral annular calcifications.   Exercise nuclear stress test 04/22/2022 Myocardial perfusion is normal. Overall LV systolic function is normal without regional wall motion abnormalities. Stress LV EF: 57%. Low risk  study. Normal ECG stress.  The patient exercised for 9 minutes and 45 seconds of a Bruce protocol, achieving approximately 11.22 METs and 94% MPHR. The heart rate response was normal. The blood pressure response was normal. No previous exam available for comparison.   Echocardiogram 04/11/2022: Normal LV systolic function with visual EF 60-65%. Left ventricle cavity is normal in size. Mild concentric hypertrophy of the left ventricle. Normal global wall motion. Doppler evidence of grade I (impaired) diastolic dysfunction, normal LAP. Calculated EF 71%. Left atrial cavity is moderately dilated at 37.5 ml/m^2. Structurally normal tricuspid valve with no regurgitation. No evidence of pulmonary hypertension. No prior available for comparison.    EKG:   04/08/2022: sinus bradycardia with variable conduction. No evidence of ischemia  Assessment     ICD-10-CM   1. Essential hypertension  I10     2. Mixed hyperlipidemia  E78.2     3. Coronary artery calcification seen on CT scan  I25.10        No orders of the defined types were placed in this encounter.   No orders of the defined types were placed in this encounter.   There are no discontinued medications.    Recommendations:   Zachary Oneal is a 72 y.o.  male with coronary artery calcification  Essential hypertension Continue current cardiac medications. Mild LVH on echocardiogram but BP well controlled now Encourage low-sodium diet, less than 2000 mg daily. Follow-up in 1-2 months or sooner if needed.   Coronary artery calcification seen on CT scan Seen when patient had LDCT for lung cancer screening Stress test was negative for reversible ischemia Restart baby aspirin 81 mg daily   Mixed hyperlipidemia Continue Crestor 20 mg Primary following lipids Patient states they have been well controlled     Floydene Flock, DO, [facility]  05/20/2022, 11:11 AM Office: [phone] Pager: [phone]

## 2022-06-12 DIAGNOSIS — K5000 Crohn's disease of small intestine without complications: Secondary | ICD-10-CM | POA: Diagnosis not present

## 2022-06-12 DIAGNOSIS — Z79899 Other long term (current) drug therapy: Secondary | ICD-10-CM | POA: Diagnosis not present

## 2022-06-13 DIAGNOSIS — Z125 Encounter for screening for malignant neoplasm of prostate: Secondary | ICD-10-CM | POA: Diagnosis not present

## 2022-06-13 DIAGNOSIS — I10 Essential (primary) hypertension: Secondary | ICD-10-CM | POA: Diagnosis not present

## 2022-06-13 DIAGNOSIS — D72829 Elevated white blood cell count, unspecified: Secondary | ICD-10-CM | POA: Diagnosis not present

## 2022-06-13 DIAGNOSIS — E782 Mixed hyperlipidemia: Secondary | ICD-10-CM | POA: Diagnosis not present

## 2022-06-17 DIAGNOSIS — J449 Chronic obstructive pulmonary disease, unspecified: Secondary | ICD-10-CM | POA: Diagnosis not present

## 2022-06-17 DIAGNOSIS — Z6827 Body mass index (BMI) 27.0-27.9, adult: Secondary | ICD-10-CM | POA: Diagnosis not present

## 2022-06-17 DIAGNOSIS — Z0000 Encounter for general adult medical examination without abnormal findings: Secondary | ICD-10-CM | POA: Diagnosis not present

## 2022-06-17 DIAGNOSIS — D72829 Elevated white blood cell count, unspecified: Secondary | ICD-10-CM | POA: Diagnosis not present

## 2022-06-17 DIAGNOSIS — R7301 Impaired fasting glucose: Secondary | ICD-10-CM | POA: Diagnosis not present

## 2022-06-17 DIAGNOSIS — F411 Generalized anxiety disorder: Secondary | ICD-10-CM | POA: Diagnosis not present

## 2022-06-17 DIAGNOSIS — G4700 Insomnia, unspecified: Secondary | ICD-10-CM | POA: Diagnosis not present

## 2022-06-17 DIAGNOSIS — E782 Mixed hyperlipidemia: Secondary | ICD-10-CM | POA: Diagnosis not present

## 2022-06-17 DIAGNOSIS — Z125 Encounter for screening for malignant neoplasm of prostate: Secondary | ICD-10-CM | POA: Diagnosis not present

## 2022-06-17 DIAGNOSIS — F324 Major depressive disorder, single episode, in partial remission: Secondary | ICD-10-CM | POA: Diagnosis not present

## 2022-06-17 DIAGNOSIS — I10 Essential (primary) hypertension: Secondary | ICD-10-CM | POA: Diagnosis not present

## 2022-06-17 DIAGNOSIS — M79671 Pain in right foot: Secondary | ICD-10-CM | POA: Diagnosis not present

## 2022-07-05 DIAGNOSIS — K5000 Crohn's disease of small intestine without complications: Secondary | ICD-10-CM | POA: Diagnosis not present

## 2022-07-12 DIAGNOSIS — K5000 Crohn's disease of small intestine without complications: Secondary | ICD-10-CM | POA: Diagnosis not present

## 2022-07-24 DIAGNOSIS — K5000 Crohn's disease of small intestine without complications: Secondary | ICD-10-CM | POA: Diagnosis not present

## 2022-08-29 DIAGNOSIS — C652 Malignant neoplasm of left renal pelvis: Secondary | ICD-10-CM | POA: Diagnosis not present

## 2022-09-04 DIAGNOSIS — K5000 Crohn's disease of small intestine without complications: Secondary | ICD-10-CM | POA: Diagnosis not present

## 2022-09-06 DIAGNOSIS — C642 Malignant neoplasm of left kidney, except renal pelvis: Secondary | ICD-10-CM | POA: Diagnosis not present

## 2022-09-06 DIAGNOSIS — C652 Malignant neoplasm of left renal pelvis: Secondary | ICD-10-CM | POA: Diagnosis not present

## 2022-09-06 DIAGNOSIS — K529 Noninfective gastroenteritis and colitis, unspecified: Secondary | ICD-10-CM | POA: Diagnosis not present

## 2022-09-25 ENCOUNTER — Inpatient Hospital Stay (HOSPITAL_BASED_OUTPATIENT_CLINIC_OR_DEPARTMENT_OTHER): Payer: Medicare Other | Admitting: Hematology & Oncology

## 2022-09-25 ENCOUNTER — Inpatient Hospital Stay: Payer: Medicare Other | Attending: Hematology & Oncology

## 2022-09-25 ENCOUNTER — Other Ambulatory Visit: Payer: Self-pay | Admitting: *Deleted

## 2022-09-25 ENCOUNTER — Other Ambulatory Visit: Payer: Self-pay

## 2022-09-25 VITALS — BP 123/64 | HR 50 | Temp 97.8°F | Resp 18 | Ht 72.0 in | Wt 184.0 lb

## 2022-09-25 DIAGNOSIS — D72829 Elevated white blood cell count, unspecified: Secondary | ICD-10-CM | POA: Insufficient documentation

## 2022-09-25 DIAGNOSIS — D72823 Leukemoid reaction: Secondary | ICD-10-CM

## 2022-09-25 DIAGNOSIS — D72820 Lymphocytosis (symptomatic): Secondary | ICD-10-CM

## 2022-09-25 DIAGNOSIS — Z79899 Other long term (current) drug therapy: Secondary | ICD-10-CM | POA: Insufficient documentation

## 2022-09-25 DIAGNOSIS — F17200 Nicotine dependence, unspecified, uncomplicated: Secondary | ICD-10-CM | POA: Insufficient documentation

## 2022-09-25 LAB — CBC WITH DIFFERENTIAL (CANCER CENTER ONLY)
Abs Immature Granulocytes: 0.1 10*3/uL — ABNORMAL HIGH (ref 0.00–0.07)
Basophils Absolute: 0.1 10*3/uL (ref 0.0–0.1)
Basophils Relative: 1 %
Eosinophils Absolute: 0.3 10*3/uL (ref 0.0–0.5)
Eosinophils Relative: 2 %
HCT: 43.4 % (ref 39.0–52.0)
Hemoglobin: 14.3 g/dL (ref 13.0–17.0)
Immature Granulocytes: 1 %
Lymphocytes Relative: 11 %
Lymphs Abs: 1.6 10*3/uL (ref 0.7–4.0)
MCH: 30.6 pg (ref 26.0–34.0)
MCHC: 32.9 g/dL (ref 30.0–36.0)
MCV: 92.7 fL (ref 80.0–100.0)
Monocytes Absolute: 0.8 10*3/uL (ref 0.1–1.0)
Monocytes Relative: 6 %
Neutro Abs: 11.1 10*3/uL — ABNORMAL HIGH (ref 1.7–7.7)
Neutrophils Relative %: 79 %
Platelet Count: 323 10*3/uL (ref 150–400)
RBC: 4.68 MIL/uL (ref 4.22–5.81)
RDW: 14.3 % (ref 11.5–15.5)
WBC Count: 14 10*3/uL — ABNORMAL HIGH (ref 4.0–10.5)
nRBC: 0 % (ref 0.0–0.2)

## 2022-09-25 LAB — CMP (CANCER CENTER ONLY)
ALT: 22 U/L (ref 0–44)
AST: 19 U/L (ref 15–41)
Albumin: 3.9 g/dL (ref 3.5–5.0)
Alkaline Phosphatase: 54 U/L (ref 38–126)
Anion gap: 6 (ref 5–15)
BUN: 12 mg/dL (ref 8–23)
CO2: 30 mmol/L (ref 22–32)
Calcium: 9.8 mg/dL (ref 8.9–10.3)
Chloride: 104 mmol/L (ref 98–111)
Creatinine: 0.93 mg/dL (ref 0.61–1.24)
GFR, Estimated: >60 mL/min (ref >60)
Glucose, Bld: 120 mg/dL — ABNORMAL HIGH (ref 70–99)
Potassium: 4.4 mmol/L (ref 3.5–5.1)
Sodium: 140 mmol/L (ref 135–145)
Total Bilirubin: 0.5 mg/dL (ref 0.3–1.2)
Total Protein: 7.1 g/dL (ref 6.5–8.1)

## 2022-09-25 LAB — SAVE SMEAR(SSMR), FOR PROVIDER SLIDE REVIEW

## 2022-09-25 LAB — LACTATE DEHYDROGENASE: LDH: 101 U/L (ref 98–192)

## 2022-09-25 NOTE — Progress Notes (Signed)
Hematology and Oncology Follow Up Visit  Zachary Oneal [id number] July 16, 1950 72 y.o. 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  We see him every year.  So far, he is doing pretty well.  He did have a bout of acid reflux recently.  He had a lot of nausea and vomiting.  I do not think this is anything related to his Crohn's disease.  Apparently, he is going to be on some kind of protocol/clinical trial for I think cholesterol.  From my point of view, I do not see probably him being on cholesterol medication. He has had no problems with his urine.  He has had no problems with bleeding.  He is still smoking.  Try to cut back.  He has had no problems with COVID's since we last saw him.  Overall, his performance status is probably ECOG 1.    Medications:  Current Outpatient Medications:    amLODipine (NORVASC) 5 MG tablet, Take 5 mg by mouth daily., Disp: , Rfl:    B Complex Vitamins (VITAMIN B COMPLEX) CAPS, Vitamin B Complex, Disp: , Rfl:    bisoprolol (ZEBETA) 5 MG tablet, TAKE 1 TABLET BY MOUTH EVERY DAY, Disp: 90 tablet, Rfl: 0   Cholecalciferol (VITAMIN D3) 50 MCG (2000 UT) TABS, Take 50 mcg by mouth daily., Disp: , Rfl:    citalopram (CELEXA) 20 MG tablet, Take 20 mg by mouth daily., Disp: , Rfl:    fluticasone (FLONASE) 50 MCG/ACT nasal spray, Place into both nostrils., Disp: , Rfl:    hydrOXYzine (ATARAX/VISTARIL) 10 MG tablet, 1 tablet, Disp: , Rfl:    mometasone-formoterol (DULERA) 100-5 MCG/ACT AERO, 2 puffs, Disp: , Rfl:    Multiple Vitamins-Minerals (MULTIVITAMIN ADULTS 50+ PO), Take 1 tablet by mouth daily., Disp: , Rfl:    rosuvastatin (CRESTOR) 20 MG tablet, 1 tablet, Disp: , Rfl:    Vedolizumab (ENTYVIO IV), Inject 1 Bag into the vein every 6 (six) weeks., Disp: , Rfl:   Allergies:   Allergies  Allergen Reactions   Hydrocodone Itching   Phenazopyridine Hcl Itching    Past Medical History, Surgical history, Social history, and Family History were reviewed and updated.  Review of Systems: Review of Systems  All other systems reviewed and are negative.  Physical Exam:  height is 6' (1.829 m) and weight is 184 lb (83.5 kg). His oral temperature is 97.8 F (36.6 C). His blood pressure is 123/64 and his pulse is 50 (abnormal). His respiration is 18 and oxygen saturation is 96%.   Wt Readings from Last 3 Encounters:  09/25/22 184 lb (83.5 kg)  05/20/22 209 lb 9.6 oz (95.1 kg)  04/08/22 204 lb (92.5 kg)     Physical Exam Vitals reviewed.  HENT:     Head: Normocephalic and atraumatic.  Eyes:     Pupils: Pupils are equal, round, and reactive to light.  Cardiovascular:     Rate and Rhythm: Normal rate and regular rhythm.     Heart sounds: Normal heart sounds.  Pulmonary:     Effort: Pulmonary effort is normal.     Breath sounds: Normal breath sounds.  Abdominal:     General: Bowel sounds are normal.     Palpations: Abdomen is soft.  Musculoskeletal:        General: No tenderness or deformity. Normal range of  motion.     Cervical back: Normal range of motion.  Lymphadenopathy:     Cervical: No cervical adenopathy.  Skin:    General: Skin is warm and dry.     Findings: No erythema or rash.  Neurological:     Mental Status: He is alert and oriented to person, place, and time.  Psychiatric:        Behavior: Behavior normal.        Thought Content: Thought content normal.        Judgment: Judgment normal.      Lab Results  Component Value Date   WBC 14.0 (H) 09/25/2022   HGB 14.3 09/25/2022   HCT 43.4 09/25/2022   MCV 92.7 09/25/2022   PLT 323 09/25/2022     Chemistry      Component Value Date/Time   NA 140 09/25/2022 1012   K 4.4 09/25/2022 1012   CL 104 09/25/2022 1012   CO2 30 09/25/2022 1012   BUN 12 09/25/2022 1012   CREATININE 0.93  09/25/2022 1012      Component Value Date/Time   CALCIUM 9.8 09/25/2022 1012   ALKPHOS 54 09/25/2022 1012   AST 19 09/25/2022 1012   ALT 22 09/25/2022 1012   BILITOT 0.5 09/25/2022 1012      Impression and Plan: Zachary Oneal is a 72 year old male. He has chronic leukocytosis.  I looked at his blood smear.  I do not see anything that looked suspicious.  He has good maturity of his white blood cells.  There are no blasts.  He has no nucleated red cells.  I realize that his white cell count will fluctuate a little bit.  We will still plan to get him back in 1 year.  Again, I do not see a problem him being on clinical trial.  Josph Macho, MD 7/3/202411:38 AM

## 2022-10-03 DIAGNOSIS — K50013 Crohn's disease of small intestine with fistula: Secondary | ICD-10-CM | POA: Diagnosis not present

## 2022-10-03 DIAGNOSIS — K219 Gastro-esophageal reflux disease without esophagitis: Secondary | ICD-10-CM | POA: Diagnosis not present

## 2022-10-03 DIAGNOSIS — R634 Abnormal weight loss: Secondary | ICD-10-CM | POA: Diagnosis not present

## 2022-10-03 DIAGNOSIS — R112 Nausea with vomiting, unspecified: Secondary | ICD-10-CM | POA: Diagnosis not present

## 2022-10-08 ENCOUNTER — Other Ambulatory Visit (HOSPITAL_COMMUNITY): Payer: Self-pay | Admitting: Gastroenterology

## 2022-10-08 DIAGNOSIS — K50013 Crohn's disease of small intestine with fistula: Secondary | ICD-10-CM | POA: Diagnosis not present

## 2022-10-08 DIAGNOSIS — K5090 Crohn's disease, unspecified, without complications: Secondary | ICD-10-CM

## 2022-10-14 ENCOUNTER — Telehealth: Payer: Self-pay | Admitting: Cardiology

## 2022-10-14 ENCOUNTER — Encounter: Payer: Self-pay | Admitting: Cardiology

## 2022-10-14 DIAGNOSIS — Z006 Encounter for examination for normal comparison and control in clinical research program: Secondary | ICD-10-CM | POA: Insufficient documentation

## 2022-10-14 HISTORY — DX: Encounter for examination for normal comparison and control in clinical research program: Z00.6

## 2022-10-14 NOTE — Telephone Encounter (Signed)
Patient has randomized into the [facility] on the Reduction of MACE w/ Elevated Lp(a) in Established CVD or High Risk for CVD ([id number]-Lp(a)) clinical trial

## 2022-10-16 DIAGNOSIS — K5000 Crohn's disease of small intestine without complications: Secondary | ICD-10-CM | POA: Diagnosis not present

## 2022-10-19 ENCOUNTER — Ambulatory Visit (HOSPITAL_COMMUNITY)
Admission: RE | Admit: 2022-10-19 | Discharge: 2022-10-19 | Disposition: A | Discharge status: Home / Self Care | Payer: Medicare Other | Source: Ambulatory Visit | Attending: Gastroenterology | Admitting: Gastroenterology

## 2022-10-19 DIAGNOSIS — K5090 Crohn's disease, unspecified, without complications: Secondary | ICD-10-CM

## 2022-10-19 DIAGNOSIS — Z85528 Personal history of other malignant neoplasm of kidney: Secondary | ICD-10-CM | POA: Diagnosis not present

## 2022-10-19 DIAGNOSIS — R160 Hepatomegaly, not elsewhere classified: Secondary | ICD-10-CM | POA: Diagnosis not present

## 2022-10-19 DIAGNOSIS — Z8551 Personal history of malignant neoplasm of bladder: Secondary | ICD-10-CM | POA: Diagnosis not present

## 2022-10-19 MED ORDER — GADOBUTROL 1 MMOL/ML IV SOLN
8.0000 mL | Freq: Once | INTRAVENOUS | Status: AC | PRN
  Administered 2022-10-19: 8 mL via INTRAVENOUS

## 2022-10-21 DIAGNOSIS — K5000 Crohn's disease of small intestine without complications: Secondary | ICD-10-CM | POA: Diagnosis not present

## 2022-10-24 ENCOUNTER — Other Ambulatory Visit (HOSPITAL_COMMUNITY): Payer: Self-pay | Admitting: Gastroenterology

## 2022-10-24 DIAGNOSIS — L0291 Cutaneous abscess, unspecified: Secondary | ICD-10-CM

## 2022-10-24 DIAGNOSIS — R9389 Abnormal findings on diagnostic imaging of other specified body structures: Secondary | ICD-10-CM

## 2022-10-24 DIAGNOSIS — L988 Other specified disorders of the skin and subcutaneous tissue: Secondary | ICD-10-CM

## 2022-10-30 DIAGNOSIS — K5000 Crohn's disease of small intestine without complications: Secondary | ICD-10-CM | POA: Diagnosis not present

## 2022-10-31 ENCOUNTER — Ambulatory Visit (HOSPITAL_COMMUNITY)
Admission: RE | Admit: 2022-10-31 | Discharge: 2022-10-31 | Disposition: A | Discharge status: Home / Self Care | Payer: Medicare Other | Source: Ambulatory Visit | Attending: Gastroenterology | Admitting: Gastroenterology

## 2022-10-31 DIAGNOSIS — C679 Malignant neoplasm of bladder, unspecified: Secondary | ICD-10-CM | POA: Diagnosis not present

## 2022-10-31 DIAGNOSIS — R9389 Abnormal findings on diagnostic imaging of other specified body structures: Secondary | ICD-10-CM | POA: Diagnosis not present

## 2022-10-31 DIAGNOSIS — L0291 Cutaneous abscess, unspecified: Secondary | ICD-10-CM | POA: Diagnosis not present

## 2022-10-31 DIAGNOSIS — L988 Other specified disorders of the skin and subcutaneous tissue: Secondary | ICD-10-CM | POA: Insufficient documentation

## 2022-10-31 DIAGNOSIS — I7102 Dissection of abdominal aorta: Secondary | ICD-10-CM | POA: Diagnosis not present

## 2022-10-31 DIAGNOSIS — C763 Malignant neoplasm of pelvis: Secondary | ICD-10-CM | POA: Diagnosis not present

## 2022-10-31 MED ORDER — IOHEXOL 350 MG/ML SOLN
75.0000 mL | Freq: Once | INTRAVENOUS | Status: AC | PRN
  Administered 2022-10-31: 75 mL via INTRAVENOUS

## 2022-10-31 MED ORDER — IOHEXOL 9 MG/ML PO SOLN
500.0000 mL | ORAL | Status: AC
  Administered 2022-10-31 (×2): 500 mL via ORAL

## 2022-11-01 ENCOUNTER — Emergency Department (HOSPITAL_COMMUNITY)
Admission: EM | Admit: 2022-11-01 | Discharge: 2022-11-01 | Disposition: A | Discharge status: Home / Self Care | Payer: Medicare Other | Source: Home / Self Care | Attending: Emergency Medicine | Admitting: Emergency Medicine

## 2022-11-01 ENCOUNTER — Encounter (HOSPITAL_COMMUNITY): Payer: Self-pay

## 2022-11-01 DIAGNOSIS — K50914 Crohn's disease, unspecified, with abscess: Secondary | ICD-10-CM | POA: Diagnosis not present

## 2022-11-01 DIAGNOSIS — K5090 Crohn's disease, unspecified, without complications: Secondary | ICD-10-CM | POA: Diagnosis not present

## 2022-11-01 HISTORY — DX: Chronic obstructive pulmonary disease, unspecified: J44.9

## 2022-11-01 LAB — COMPREHENSIVE METABOLIC PANEL
ALT: 32 U/L (ref 0–44)
AST: 25 U/L (ref 15–41)
Albumin: 3.5 g/dL (ref 3.5–5.0)
Alkaline Phosphatase: 53 U/L (ref 38–126)
Anion gap: 10 (ref 5–15)
BUN: 12 mg/dL (ref 8–23)
CO2: 22 mmol/L (ref 22–32)
Calcium: 8.9 mg/dL (ref 8.9–10.3)
Chloride: 104 mmol/L (ref 98–111)
Creatinine, Ser: 0.77 mg/dL (ref 0.61–1.24)
GFR, Estimated: >60 mL/min (ref >60)
Glucose, Bld: 110 mg/dL — ABNORMAL HIGH (ref 70–99)
Potassium: 4 mmol/L (ref 3.5–5.1)
Sodium: 136 mmol/L (ref 135–145)
Total Bilirubin: 0.5 mg/dL (ref 0.3–1.2)
Total Protein: 7.3 g/dL (ref 6.5–8.1)

## 2022-11-01 LAB — CBC WITH DIFFERENTIAL/PLATELET
Abs Immature Granulocytes: 0.06 10*3/uL (ref 0.00–0.07)
Basophils Absolute: 0.1 10*3/uL (ref 0.0–0.1)
Basophils Relative: 0 %
Eosinophils Absolute: 0.2 10*3/uL (ref 0.0–0.5)
Eosinophils Relative: 1 %
HCT: 41.1 % (ref 39.0–52.0)
Hemoglobin: 13.4 g/dL (ref 13.0–17.0)
Immature Granulocytes: 0 %
Lymphocytes Relative: 12 %
Lymphs Abs: 1.6 10*3/uL (ref 0.7–4.0)
MCH: 29.8 pg (ref 26.0–34.0)
MCHC: 32.6 g/dL (ref 30.0–36.0)
MCV: 91.5 fL (ref 80.0–100.0)
Monocytes Absolute: 0.7 10*3/uL (ref 0.1–1.0)
Monocytes Relative: 5 %
Neutro Abs: 11.6 10*3/uL — ABNORMAL HIGH (ref 1.7–7.7)
Neutrophils Relative %: 82 %
Platelets: 294 10*3/uL (ref 150–400)
RBC: 4.49 MIL/uL (ref 4.22–5.81)
RDW: 15.1 % (ref 11.5–15.5)
WBC: 14.2 10*3/uL — ABNORMAL HIGH (ref 4.0–10.5)
nRBC: 0 % (ref 0.0–0.2)

## 2022-11-01 LAB — LIPASE, BLOOD: Lipase: 55 U/L — ABNORMAL HIGH (ref 11–51)

## 2022-11-01 NOTE — Discharge Instructions (Signed)
Continue your Augmentin.  Give your [place] [facility] specialist call on Monday.  On-call GI specialist today's going to send a message to them.  Return for fevers persistent vomiting any new or worse symptoms.

## 2022-11-01 NOTE — ED Triage Notes (Signed)
Pt referred by GI for 18k WBC, pt states normal for him is 14k. MRI late July showed abscess d/t crohns, started on augmentin, but after labs Weds was referred here to r/o sepsis and for possible IR drainage. No pain, no fevers

## 2022-11-01 NOTE — ED Provider Notes (Addendum)
[place] EMERGENCY DEPARTMENT AT [facility] Provider Note   CSN: [id number] Arrival date & time: 11/01/22  1053     History  Chief Complaint  Patient presents with   Abscess    Zachary Oneal is a 72 y.o. male.  Patient referred in by his gastroenterology office.  Patient is followed by [facility].  Dr. Sammuel Cooper.  He is currently out of the office today on vacation.  Patient had MRI done in the end of July specifically on July 27 that raise concerns for fistula and intra-abdominal abscess.  Patient opted to do a 2-week course of Augmentin patient had lab work done because he is also followed at [facility] for his Crohn's disease and his white count was 18,000 he is not on any steroids currently.  States that his white count is normally 14,000.  Had CT scan done ordered by his local gastroenterologist yesterday.  But results have not been read yet.  Patient denies feeling sick or any worse.  No fevers occasionally gets a little bit of abdominal twitchy discomfort but nothing out of the ordinary compared to what he usually has.  He was sent in for concerns of possible sepsis but his temp here is 98.2 pulse 57 respiration 17 blood pressure 135/69 oxygen saturation 97%.  Also sent in that maybe he would need interventional radiology to drain it.  As stated patient denies any fevers any significant abdominal pain any nausea vomiting or diarrhea.       Home Medications Prior to Admission medications   Medication Sig Start Date End Date Taking? Authorizing Provider  amLODipine (NORVASC) 5 MG tablet Take 5 mg by mouth daily. 07/22/19  Yes [provider]  amoxicillin-clavulanate (AUGMENTIN) 875-125 MG tablet Take 1 tablet by mouth 2 (two) times daily. 10/31/22  Yes [provider]  B Complex Vitamins (VITAMIN B COMPLEX) CAPS Take 1 capsule by mouth daily.   Yes [provider]  bisoprolol (ZEBETA) 5 MG tablet TAKE 1 TABLET BY MOUTH EVERY DAY Patient taking  differently: Take 5 mg by mouth daily. 08/14/17  Yes Nyoka Cowden, MD  Cholecalciferol (VITAMIN D3) 50 MCG (2000 UT) TABS Take 50 mcg by mouth daily.   Yes [provider]  citalopram (CELEXA) 20 MG tablet Take 20 mg by mouth daily.   Yes [provider]  fluticasone (FLONASE) 50 MCG/ACT nasal spray Place 2 sprays into both nostrils daily as needed for allergies. 06/15/19  Yes [provider]  hydrOXYzine (ATARAX/VISTARIL) 10 MG tablet Take 10 mg by mouth daily as needed for anxiety.   Yes [provider]  mometasone-formoterol (DULERA) 100-5 MCG/ACT AERO Inhale 2 puffs into the lungs daily as needed for shortness of breath. 02/24/20  Yes [provider]  Multiple Vitamins-Minerals (MULTIVITAMIN ADULTS 50+ PO) Take 1 tablet by mouth daily.   Yes [provider]  rosuvastatin (CRESTOR) 20 MG tablet Take 20 mg by mouth daily.   Yes [provider]  traZODone (DESYREL) 50 MG tablet Take 25-100 mg by mouth at bedtime as needed. 10/09/22  Yes [provider]  Vedolizumab (ENTYVIO IV) Inject 1 Bag into the vein every 6 (six) weeks.   Yes [provider]  Investigational - Study Medication Study name: Lepodisiran on the Reduction of MACE w/ Elevated Lp(a) in Established CVD or High Risk for CVD ([id number]-ACCLAIM-Lp(a)) Additional study details: [employer] [phone]    [provider]      Allergies    Hydrocodone  and Phenazopyridine hcl    Review of Systems   Review of Systems  Constitutional:  Negative for chills and fever.  HENT:  Negative for ear pain and sore throat.   Eyes:  Negative for pain and visual disturbance.  Respiratory:  Negative for cough and shortness of breath.   Cardiovascular:  Negative for chest pain and palpitations.  Gastrointestinal:  Positive for abdominal pain. Negative for vomiting.  Genitourinary:  Negative for dysuria and hematuria.  Musculoskeletal:  Negative  for arthralgias and back pain.  Skin:  Negative for color change and rash.  Neurological:  Negative for seizures and syncope.  All other systems reviewed and are negative.   Physical Exam Updated Vital Signs BP 135/69 (BP Location: Left Arm)   Pulse (!) 57   Temp 98.2 F (36.8 C) (Oral)   Resp 17   Ht 1.829 m (6')   Wt 83.9 kg   SpO2 97%   BMI 25.09 kg/m  Physical Exam Vitals and nursing note reviewed.  Constitutional:      General: He is not in acute distress.    Appearance: Normal appearance. He is well-developed.  HENT:     Head: Normocephalic and atraumatic.  Eyes:     Extraocular Movements: Extraocular movements intact.     Conjunctiva/sclera: Conjunctivae normal.     Pupils: Pupils are equal, round, and reactive to light.  Cardiovascular:     Rate and Rhythm: Normal rate and regular rhythm.     Heart sounds: No murmur heard. Pulmonary:     Effort: Pulmonary effort is normal. No respiratory distress.     Breath sounds: Normal breath sounds.  Abdominal:     General: There is no distension.     Palpations: Abdomen is soft.     Tenderness: There is no abdominal tenderness. There is no guarding.  Musculoskeletal:        General: No swelling.     Cervical back: Normal range of motion and neck supple.  Skin:    General: Skin is warm and dry.     Capillary Refill: Capillary refill takes less than 2 seconds.  Neurological:     General: No focal deficit present.     Mental Status: He is alert and oriented to person, place, and time.  Psychiatric:        Mood and Affect: Mood normal.     ED Results / Procedures / Treatments   Labs (all labs ordered are listed, but only abnormal results are displayed) Labs Reviewed  CBC WITH DIFFERENTIAL/PLATELET - Abnormal; Notable for the following components:      Result Value   WBC 14.2 (*)    Neutro Abs 11.6 (*)    All other components within normal limits  COMPREHENSIVE METABOLIC PANEL  LIPASE, BLOOD     EKG None  Radiology No results found.  Procedures Procedures    Medications Ordered in ED Medications - No data to display  ED Course/ Medical Decision Making/ A&P                                 Medical Decision Making Amount and/or Complexity of Data Reviewed Labs: ordered.   Discussed with radiology department they will read CT scan from yesterday so that we can get a report on that.  Patient nontoxic no acute distress.  Patient's vital signs here today not consistent with any concerns for sepsis.  Based on MRI from  the end of July there was an intra-abdominal abscess and may be fistula.  Patient referred in because white count was still 18,000 although he not really having any symptoms and he is already completed a 2-week Augmentin course.  CBC here today white count is 14,000 hemoglobin is 13.4.  Platelets are 294.  Complete metabolic panel and lipase still pending.  White blood cell count is back to what patient states is normally his baseline.  CT scan officially read.  Does definitely show evidence of a fair amount of inflammation with fistula.  No distinct abscess but could be abscess.  Will discuss with [facility] gastroenterology.  Patient nontoxic no acute distress.  Patient's lipase is 55 complete metabolic panel without any significant abnormalities renal function normal.  Discussed with Dr. Ewing Schlein on-call for [facility].  Patient seems to be pretty stable here nontoxic no acute distress.  He is going to message his GI physician Dr. Manson Passey hot and they will have the patient call him on Monday.  He will be back from vacation on Monday.  Patient still on Augmentin we will have him continue that.  Patient had finished up his treatment with Entyvio IV.  And they were planning to start him on Mira.  The immunotherapy but they were putting that on hold while they were trying to sort this out.  CT results from yesterday included in this note.  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  Not clear whether it is fluid in the fistula or whether it is abscess.  All this information was relayed to Dr. Ewing Schlein  Patient stable for discharge home he will return for any new or worse symptoms.  He will continue his Augmentin treatment.   Results for orders placed or performed during the hospital encounter of 11/01/22  CBC with Differential/Platelet  Result Value Ref Range   WBC 14.2 (H) 4.0 - 10.5 K/uL   RBC 4.49 4.22 - 5.81 MIL/uL   Hemoglobin 13.4 13.0 - 17.0 g/dL   HCT 62.1 30.8 - 65.7 %   MCV 91.5 80.0 - 100.0 fL   MCH 29.8 26.0 - 34.0 pg   MCHC 32.6 30.0 - 36.0 g/dL   RDW 84.6 96.2 - 95.2 %   Platelets 294 150 - 400 K/uL   nRBC 0.0 0.0 - 0.2 %   Neutrophils Relative % 82 %   Neutro Abs 11.6 (H) 1.7 - 7.7 K/uL   Lymphocytes Relative 12 %   Lymphs Abs 1.6 0.7 - 4.0 K/uL   Monocytes Relative 5 %   Monocytes Absolute 0.7 0.1 - 1.0 K/uL   Eosinophils Relative 1 %   Eosinophils Absolute 0.2 0.0 - 0.5 K/uL   Basophils Relative 0 %   Basophils Absolute 0.1 0.0 - 0.1 K/uL   Immature Granulocytes 0 %   Abs Immature Granulocytes 0.06 0.00 - 0.07 K/uL  Comprehensive metabolic panel  Result Value Ref Range   Sodium 136 135 - 145 mmol/L   Potassium 4.0 3.5 - 5.1 mmol/L   Chloride 104 98 - 111 mmol/L   CO2 22 22 - 32 mmol/L   Glucose, Bld 110 (H) 70 - 99 mg/dL   BUN 12 8 - 23 mg/dL   Creatinine, Ser 8.41 0.61 - 1.24 mg/dL   Calcium 8.9 8.9 - 32.4 mg/dL   Total Protein 7.3 6.5 - 8.1 g/dL   Albumin 3.5 3.5 - 5.0 g/dL   AST 25 15 - 41  U/L   ALT 32 0 - 44 U/L   Alkaline Phosphatase 53 38 - 126 U/L   Total Bilirubin 0.5 0.3 - 1.2 mg/dL   GFR, Estimated >72 >53 mL/min   Anion gap 10 5 - 15  Lipase, blood  Result Value Ref Range   Lipase 55 (H) 11 - 51 U/L   CT ABDOMEN PELVIS W CONTRAST  Result Date: 11/01/2022 CLINICAL DATA:  Crohn's disease, 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 * Tracking Code: BO * EXAM: CT ABDOMEN AND PELVIS WITH CONTRAST TECHNIQUE: Multidetector CT imaging of the abdomen and pelvis was performed using the standard protocol following bolus administration of intravenous contrast. RADIATION DOSE REDUCTION: This exam was performed according to the departmental dose-optimization program which includes automated exposure control, adjustment of the mA and/or kV according to patient size and/or use of iterative reconstruction technique. CONTRAST:  75mL OMNIPAQUE IOHEXOL 350 MG/ML SOLN COMPARISON:  10/19/2022 FINDINGS: Lower chest: Dense mitral valve calcification. Descending thoracic aortic atherosclerotic vascular disease. Hepatobiliary: Unremarkable Pancreas: Unremarkable Spleen: Unremarkable Adrenals/Urinary Tract: Currently no visible mass along the urothelium. No significant renal lesions. Adrenal glands unremarkable. Stomach/Bowel: Severe circumferential wall thickening of the distal 14 cm of the ileum with string sign and ulcerations in the wall of the terminal ileum. Inflammation at the ileocecal valve with mild inflammation in the adjacent cecum. Just above the proximal inflamed margin of the terminal ileum, there is a bandlike connection extending from the inflamed small bowel to the cecum in a manner highly characteristic of a fistula, for example on images 57-58 of series 3. A cavitary component of this collection just above the terminal ileum measures 3.1 by 1.6 by 1.9 cm. Although appearance favors a fistula, the oral contrast extending through the cecum and terminal ileum does not extend into this collection. Accordingly this could represent an abscess cavity or simply non preferential flow of fluid through the fistula. Currently the rectum does not appear substantially involved. No other skip lesions are identified. Vascular/Lymphatic: Atherosclerosis is present, including aortoiliac atherosclerotic disease. There is substantial atheromatous plaque  proximally in the celiac trunk at its origin, and moderate atheromatous plaque proximally in the SMA, although no overt occlusion of either vessel. Substantial atheromatous vascular calcification in the distal abdominal aorta and iliac arteries. 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 Reproductive: Unremarkable Other: No supplemental non-categorized findings. Musculoskeletal: Lumbar spondylosis and degenerative disc disease particularly at the L5-S1 level where there is bilateral foraminal stenosis. Mild right foraminal stenosis at L4-5. IMPRESSION: 1. Severe circumferential wall thickening of the distal 14 cm of the ileum with string sign and ulcerations in the wall of the terminal ileum. Inflammation at the ileocecal valve with mild inflammation in the adjacent cecum. 2. Just above the proximal inflamed margin of the terminal ileum, there is a bandlike connection extending from the inflamed small bowel to the cecum in a manner highly characteristic of a fistula. A cavitary component of this collection just above the terminal ileum measures 3.1 by 1.6 by 1.9 cm. The oral contrast extending through the adjacent bowel does not extend into this collection, this could represent an abscess cavity or simply non preferential flow of bowel contents through the fistula. 3. No other skip lesions are identified. 4. Dense mitral valve calcification. 5. Lumbar spondylosis and degenerative disc disease particularly at L5-S1 where there is bilateral foraminal stenosis. 6.  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 7. Aortic atherosclerosis. Aortic Atherosclerosis (ICD10-I70.0). Electronically Signed   By: Gaylyn Rong M.D.   On: 11/01/2022 13:29   MR ENTERO PELVIS W WO CONTRAST  Result Date: 10/19/2022 CLINICAL DATA:  Crohn's disease, additional history of left-sided renal pelvic cancer and  bladder cancer status post TURBT EXAM: MR ABDOMEN AND PELVIS WITHOUT AND WITH CONTRAST (MR ENTEROGRAPHY) TECHNIQUE: Multiplanar, multisequence MRI of the abdomen and pelvis was performed both before and during bolus administration of intravenous contrast. Negative oral contrast VoLumen was given. CONTRAST:  8mL GADAVIST GADOBUTROL 1 MMOL/ML IV SOLN COMPARISON:  CT abdomen pelvis, 09/06/2022 FINDINGS: COMBINED FINDINGS FOR BOTH MR ABDOMEN AND PELVIS Examination is significantly limited by breath motion artifact throughout, particularly multiphasic contrast enhanced sequences. Lower chest: No acute abnormality. Hepatobiliary: No solid liver abnormality is seen. Hepatomegaly, maximum coronal span 20.8 cm. No gallstones, gallbladder wall thickening, or biliary dilatation. Pancreas: Unremarkable. No pancreatic ductal dilatation or surrounding inflammatory changes. Spleen: Normal in size without significant abnormality. Adrenals/Urinary Tract: Adrenal glands are unremarkable. Simple, benign bilateral fluid signal renal cortical cysts, including of the midportion of the left kidney, requiring no specific further follow-up or characterization (series 6, image 29). Kidneys are otherwise normal on this non tailored examination, without obvious renal calculi, solid lesion, or hydronephrosis. Bladder is unremarkable. Stomach/Bowel: Stomach is within normal limits. Small bowel is underdistended by oral enteric contrast, which limits assessment. Severely thickened, hyperenhancing, and tethered appearing terminal ileum, appearance and configuration generally unchanged in comparison to prior examination, involving approximately the distal 25 cm of terminal ileum (series 1003, image 173, series 11, image 102). 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 The rectum is decompressed, but there may be some additional wall thickening and mucosal hyperenhancement (series 1003, image 196). Large burden of stool throughout the colon. No bowel obstruction. 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 No enlarged abdominal or pelvic lymph nodes. Reproductive: No mass or other significant abnormality. Other: No abdominal wall hernia or abnormality. No ascites. Musculoskeletal: No acute or significant osseous findings. IMPRESSION: 1. Examination is significantly limited by breath motion artifact as well as underdistention of the bowel. 2. Within this limitation, severely thickened, hyperenhancing, and tethered appearing terminal ileum, appearance and configuration generally unchanged in comparison to prior examination, involving approximately the distal 25 cm of terminal ileum, consistent with Crohn's ileitis and substantial, active inflammation. 3. 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 4. The rectum is decompressed, but there may be some additional wall thickening and mucosal hyperenhancement, suggesting additional rectal involvement of Crohn's disease. 5. Large burden of stool throughout the colon. No overt stricturing or bowel obstruction. 6. 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 7. Hepatomegaly. These results will be called  to the  ordering clinician or representative by the Radiologist Assistant, and communication documented in the PACS or [employer]. Electronically Signed   By: Jearld Lesch M.D.   On: 10/19/2022 14:47   MR ENTERO ABDOMEN W WO CONTRAST  Result Date: 10/19/2022 CLINICAL DATA:  Crohn's disease, additional history of left-sided renal pelvic cancer and bladder cancer status post TURBT EXAM: MR ABDOMEN AND PELVIS WITHOUT AND WITH CONTRAST (MR ENTEROGRAPHY) TECHNIQUE: Multiplanar, multisequence MRI of the abdomen and pelvis was performed both before and during bolus administration of intravenous contrast. Negative oral contrast VoLumen was given. CONTRAST:  8mL GADAVIST GADOBUTROL 1 MMOL/ML IV SOLN COMPARISON:  CT abdomen pelvis, 09/06/2022 FINDINGS: COMBINED FINDINGS FOR BOTH MR ABDOMEN AND PELVIS Examination is significantly limited by breath motion artifact throughout, particularly multiphasic contrast enhanced sequences. Lower chest: No acute abnormality. Hepatobiliary: No solid liver abnormality is seen. Hepatomegaly, maximum coronal span 20.8 cm. No gallstones, gallbladder wall thickening, or biliary dilatation. Pancreas: Unremarkable. No pancreatic ductal dilatation or surrounding inflammatory changes. Spleen: Normal in size without significant abnormality. Adrenals/Urinary Tract: Adrenal glands are unremarkable. Simple, benign bilateral fluid signal renal cortical cysts, including of the midportion of the left kidney, requiring no specific further follow-up or characterization (series 6, image 29). Kidneys are otherwise normal on this non tailored examination, without obvious renal calculi, solid lesion, or hydronephrosis. Bladder is unremarkable. Stomach/Bowel: Stomach is within normal limits. Small bowel is underdistended by oral enteric contrast, which limits assessment. Severely thickened, hyperenhancing, and tethered appearing terminal ileum, appearance and configuration generally unchanged in comparison to  prior examination, involving approximately the distal 25 cm of terminal ileum (series 1003, image 173, series 11, image 102). 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 The rectum is decompressed, but there may be some additional wall thickening and mucosal hyperenhancement (series 1003, image 196). Large burden of stool throughout the colon. No bowel obstruction. 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 No enlarged abdominal or pelvic lymph nodes. Reproductive: No mass or other significant abnormality. Other: No abdominal wall hernia or abnormality. No ascites. Musculoskeletal: No acute or significant osseous findings. IMPRESSION: 1. Examination is significantly limited by breath motion artifact as well as underdistention of the bowel. 2. Within this limitation, severely thickened, hyperenhancing, and tethered appearing terminal ileum, appearance and configuration generally unchanged in comparison to prior examination, involving approximately the distal 25 cm of terminal ileum, consistent with Crohn's ileitis and substantial, active inflammation. 3. 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 4. The rectum is decompressed, but there may be some additional wall thickening and mucosal hyperenhancement,  suggesting additional rectal involvement of Crohn's disease. 5. Large burden of stool throughout the colon. No overt stricturing or bowel obstruction. 6. 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 7. Hepatomegaly. These results will be  called to the ordering clinician or representative by the Radiologist Assistant, and communication documented in the PACS or [employer]. Electronically Signed   By: Jearld Lesch M.D.   On: 10/19/2022 14:47    Final Clinical Impression(s) / ED Diagnoses Final diagnoses:  Crohn's disease with abscess, unspecified gastrointestinal tract location [facility])    Rx / DC Orders ED Discharge Orders     None         Vanetta Mulders, MD 11/01/22 1259    Vanetta Mulders, MD 11/01/22 1337    Vanetta Mulders, MD 11/01/22 1458    Vanetta Mulders, MD 11/01/22 1500

## 2022-11-03 DIAGNOSIS — K50914 Crohn's disease, unspecified, with abscess: Secondary | ICD-10-CM | POA: Diagnosis not present

## 2022-11-07 ENCOUNTER — Other Ambulatory Visit (HOSPITAL_COMMUNITY): Payer: Self-pay | Admitting: Gastroenterology

## 2022-11-07 DIAGNOSIS — L988 Other specified disorders of the skin and subcutaneous tissue: Secondary | ICD-10-CM

## 2022-11-08 ENCOUNTER — Other Ambulatory Visit (HOSPITAL_COMMUNITY): Payer: Self-pay | Admitting: Gastroenterology

## 2022-11-08 ENCOUNTER — Encounter: Payer: Self-pay | Admitting: General Practice

## 2022-11-08 DIAGNOSIS — L0291 Cutaneous abscess, unspecified: Secondary | ICD-10-CM

## 2022-11-08 DIAGNOSIS — L988 Other specified disorders of the skin and subcutaneous tissue: Secondary | ICD-10-CM

## 2022-11-08 NOTE — Progress Notes (Signed)
Gilmer Mor, DO  Rosezella Kronick, Katheren Shams, NT I have spoken with Dr. Levora Angel.    We can remove order.    We will not attempt drainage now.  He is going to rescan in some time, and we can review again later.  Thank you JW       Previous Messages    ----- Message ----- From: Caroleen Hamman, NT Sent: 11/07/2022   3:05 PM EDT To: [facility] Procedure Requests Subject: [place] IMAGE GUIDED FLUID DRAIN BY CATHETER        Procedure: [place] IMAGE GUIDED FLUID DRAIN BY CATHETER   Reason: Fistula, Drainage of fistula abscess  History: MR and CT in chart  Provider: Kathi Der, MD  Contact: [phone]

## 2022-11-11 ENCOUNTER — Other Ambulatory Visit (HOSPITAL_COMMUNITY): Payer: Self-pay | Admitting: Gastroenterology

## 2022-11-11 DIAGNOSIS — R109 Unspecified abdominal pain: Secondary | ICD-10-CM

## 2022-11-11 DIAGNOSIS — L0291 Cutaneous abscess, unspecified: Secondary | ICD-10-CM

## 2022-11-11 DIAGNOSIS — Z8719 Personal history of other diseases of the digestive system: Secondary | ICD-10-CM

## 2022-11-11 DIAGNOSIS — L988 Other specified disorders of the skin and subcutaneous tissue: Secondary | ICD-10-CM

## 2022-11-18 ENCOUNTER — Ambulatory Visit: Payer: Medicare Other | Admitting: Cardiology

## 2022-11-18 DIAGNOSIS — M542 Cervicalgia: Secondary | ICD-10-CM | POA: Diagnosis not present

## 2022-11-18 DIAGNOSIS — Z6826 Body mass index (BMI) 26.0-26.9, adult: Secondary | ICD-10-CM | POA: Diagnosis not present

## 2022-11-19 ENCOUNTER — Other Ambulatory Visit: Payer: Self-pay | Admitting: Family Medicine

## 2022-11-19 DIAGNOSIS — M542 Cervicalgia: Secondary | ICD-10-CM

## 2022-11-20 DIAGNOSIS — Z23 Encounter for immunization: Secondary | ICD-10-CM | POA: Diagnosis not present

## 2022-11-22 ENCOUNTER — Other Ambulatory Visit: Payer: Medicare Other

## 2022-11-26 ENCOUNTER — Ambulatory Visit (HOSPITAL_COMMUNITY)
Admission: RE | Admit: 2022-11-26 | Discharge: 2022-11-26 | Disposition: A | Discharge status: Home / Self Care | Payer: Medicare Other | Source: Ambulatory Visit | Attending: Gastroenterology | Admitting: Gastroenterology

## 2022-11-26 DIAGNOSIS — L988 Other specified disorders of the skin and subcutaneous tissue: Secondary | ICD-10-CM | POA: Insufficient documentation

## 2022-11-26 DIAGNOSIS — C679 Malignant neoplasm of bladder, unspecified: Secondary | ICD-10-CM | POA: Diagnosis not present

## 2022-11-26 DIAGNOSIS — K5790 Diverticulosis of intestine, part unspecified, without perforation or abscess without bleeding: Secondary | ICD-10-CM | POA: Diagnosis not present

## 2022-11-26 DIAGNOSIS — L0291 Cutaneous abscess, unspecified: Secondary | ICD-10-CM | POA: Insufficient documentation

## 2022-11-26 DIAGNOSIS — R109 Unspecified abdominal pain: Secondary | ICD-10-CM | POA: Diagnosis not present

## 2022-11-26 DIAGNOSIS — C763 Malignant neoplasm of pelvis: Secondary | ICD-10-CM | POA: Diagnosis not present

## 2022-11-26 DIAGNOSIS — R935 Abnormal findings on diagnostic imaging of other abdominal regions, including retroperitoneum: Secondary | ICD-10-CM | POA: Diagnosis not present

## 2022-11-26 MED ORDER — IOHEXOL 350 MG/ML SOLN
75.0000 mL | Freq: Once | INTRAVENOUS | Status: AC | PRN
  Administered 2022-11-26: 75 mL via INTRAVENOUS

## 2022-11-29 ENCOUNTER — Ambulatory Visit
Admission: RE | Admit: 2022-11-29 | Discharge: 2022-11-29 | Disposition: A | Discharge status: Home / Self Care | Payer: Medicare Other | Source: Ambulatory Visit | Attending: Family Medicine | Admitting: Family Medicine

## 2022-11-29 DIAGNOSIS — M542 Cervicalgia: Secondary | ICD-10-CM

## 2022-11-29 DIAGNOSIS — E042 Nontoxic multinodular goiter: Secondary | ICD-10-CM | POA: Diagnosis not present

## 2022-12-03 ENCOUNTER — Other Ambulatory Visit: Payer: Self-pay | Admitting: Family Medicine

## 2022-12-03 DIAGNOSIS — I6523 Occlusion and stenosis of bilateral carotid arteries: Secondary | ICD-10-CM

## 2022-12-04 ENCOUNTER — Encounter: Payer: Self-pay | Admitting: Cardiology

## 2022-12-04 ENCOUNTER — Ambulatory Visit: Payer: Medicare Other | Admitting: Cardiology

## 2022-12-04 VITALS — BP 143/49 | HR 52 | Resp 16 | Ht 72.0 in | Wt 196.6 lb

## 2022-12-04 DIAGNOSIS — I10 Essential (primary) hypertension: Secondary | ICD-10-CM | POA: Diagnosis not present

## 2022-12-04 DIAGNOSIS — E782 Mixed hyperlipidemia: Secondary | ICD-10-CM

## 2022-12-04 DIAGNOSIS — I6523 Occlusion and stenosis of bilateral carotid arteries: Secondary | ICD-10-CM

## 2022-12-04 DIAGNOSIS — I2510 Atherosclerotic heart disease of native coronary artery without angina pectoris: Secondary | ICD-10-CM

## 2022-12-04 DIAGNOSIS — Z720 Tobacco use: Secondary | ICD-10-CM

## 2022-12-04 MED ORDER — LOSARTAN POTASSIUM 25 MG PO TABS
25.0000 mg | ORAL_TABLET | Freq: Every evening | ORAL | 0 refills | Status: DC
Start: 2022-12-04 — End: 2023-02-14

## 2022-12-04 MED ORDER — AMLODIPINE BESYLATE 10 MG PO TABS
10.0000 mg | ORAL_TABLET | Freq: Every day | ORAL | 0 refills | Status: DC
Start: 2022-12-04 — End: 2023-02-14

## 2022-12-04 NOTE — Progress Notes (Signed)
Primary Physician/Referring:  Daisy Floro, MD  Patient ID: Zachary Oneal, male    DOB: 08-13-1950, 72 y.o.   MRN: [id number]  Chief Complaint  Patient presents with   Hypertension   Follow-up    6 month   Coronary Artery Disease   HPI:    Zachary Oneal  is a 72 y.o. male with past medical history significant for hypertension, hyperlipidemia, crohn's disease and COPD who is here for a follow-up visit.  Patient denies chest pain, palpitations, diaphoresis, syncope, edema, orthopnea, claudication, PND.  He has had ED visit for Crohn's since last OV and was on antibiotics.  Due to neck pain, he also sounds which revealed significant amount of carotid atherosclerosis right worse than the left.  Past Medical History:  Diagnosis Date   COPD (chronic obstructive pulmonary disease) (HCC)    Hypertension    Research study patient: ACCLAIM-Lp(a)) clinical trial  10/14/2022 10/14/2022   Patient has randomized into the [employer] on the Reduction of MACE w/ Elevated Lp(a) in Established CVD or High Risk for CVD (J3L-MC-EZEF-ACCLAIM-Lp(a)) clinical trial  10/14/2022     Ureteral mass    Wears glasses    Past Surgical History:  Procedure Laterality Date   CYSTOSCOPY WITH RETROGRADE PYELOGRAM, URETEROSCOPY AND STENT PLACEMENT Bilateral 12/06/2015   Procedure: CYSTOSCOPY WITH BILATERAL RETROGRADE PYELOGRAM, LEFT URETERAL BALLOON DILITATION, LEFT URETEROSCOPY AND LEFT URETERAL BIOPSY AND LEFT URETERAL STENT PLACEMENT;  Surgeon: Crist Fat, MD;  Location: [facility];  Service: Urology;  Laterality: Bilateral;   WISDOM TOOTH EXTRACTION     Family History  Problem Relation Age of Onset   Cancer Mother        unknown type   Pancreatic cancer Father     Social History   Tobacco Use   Smoking status: Every Day    Current packs/day: 1.00    Average packs/day: 1 pack/day for 20.0 years (20.0 ttl pk-yrs)    Types: Cigarettes   Smokeless tobacco: Never  Substance  Use Topics   Alcohol use: Yes    Alcohol/week: 0.0 standard drinks of alcohol    Comment: socially   Marital Status: Married  ROS  Review of Systems  Cardiovascular:  Negative for chest pain, dyspnea on exertion and leg swelling.   Objective  Blood pressure (!) 143/49, pulse (!) 52, resp. rate 16, height 6' (1.829 m), weight 196 lb 9.6 oz (89.2 kg), SpO2 97%. Body mass index is 26.66 kg/m.     12/04/2022   11:09 AM 12/04/2022   11:00 AM 11/01/2022    2:57 PM  Vitals with BMI  Height  6\' 0"    Weight  196 lbs 10 oz   BMI  26.66   Systolic 143 127 952  Diastolic 49 55 69  Pulse 52 51 59     Physical Exam Neck:     Vascular: Carotid bruit (right) present. No JVD.  Cardiovascular:     Rate and Rhythm: Normal rate and regular rhythm.     Pulses: Intact distal pulses.     Heart sounds: Normal heart sounds. No murmur heard.    No gallop.  Pulmonary:     Effort: Pulmonary effort is normal.     Breath sounds: Normal breath sounds.  Abdominal:     General: Bowel sounds are normal.     Palpations: Abdomen is soft.  Musculoskeletal:     Right lower leg: No edema.     Left lower leg: No edema.  Medications and allergies   Allergies  Allergen Reactions   Hydrocodone Itching   Phenazopyridine Hcl Itching     Medication list after today's encounter   Current Outpatient Medications:    aspirin 81 MG chewable tablet, Chew 81 mg by mouth daily., Disp: , Rfl:    B Complex Vitamins (VITAMIN B COMPLEX) CAPS, Take 1 capsule by mouth daily., Disp: , Rfl:    bisoprolol (ZEBETA) 5 MG tablet, TAKE 1 TABLET BY MOUTH EVERY DAY (Patient taking differently: Take 5 mg by mouth daily.), Disp: 90 tablet, Rfl: 0   Cholecalciferol (VITAMIN D3) 50 MCG (2000 UT) TABS, Take 50 mcg by mouth daily., Disp: , Rfl:    citalopram (CELEXA) 20 MG tablet, Take 20 mg by mouth daily., Disp: , Rfl:    fluticasone (FLONASE) 50 MCG/ACT nasal spray, Place 2 sprays into both nostrils daily as needed for  allergies., Disp: , Rfl:    hydrOXYzine (ATARAX/VISTARIL) 10 MG tablet, Take 10 mg by mouth daily as needed for anxiety., Disp: , Rfl:    losartan (COZAAR) 25 MG tablet, Take 1 tablet (25 mg total) by mouth every evening., Disp: 90 tablet, Rfl: 0   mometasone-formoterol (DULERA) 100-5 MCG/ACT AERO, Inhale 2 puffs into the lungs daily as needed for shortness of breath., Disp: , Rfl:    Multiple Vitamins-Minerals (MULTIVITAMIN ADULTS 50+ PO), Take 1 tablet by mouth daily., Disp: , Rfl:    rosuvastatin (CRESTOR) 20 MG tablet, Take 20 mg by mouth daily., Disp: , Rfl:    traZODone (DESYREL) 50 MG tablet, Take 25-100 mg by mouth at bedtime as needed., Disp: , Rfl:    amLODipine (NORVASC) 10 MG tablet, Take 1 tablet (10 mg total) by mouth daily., Disp: 90 tablet, Rfl: 0  Laboratory examination:   Lab Results  Component Value Date   NA 136 11/01/2022   K 4.0 11/01/2022   CO2 22 11/01/2022   GLUCOSE 110 (H) 11/01/2022   BUN 12 11/01/2022   CREATININE 0.77 11/01/2022   CALCIUM 8.9 11/01/2022   GFRNONAA >60 11/01/2022       Latest Ref Rng & Units 11/01/2022   12:11 PM 09/25/2022   10:12 AM 09/24/2021   10:04 AM  CMP  Glucose 70 - 99 mg/dL 638  756  433   BUN 8 - 23 mg/dL 12  12  13    Creatinine 0.61 - 1.24 mg/dL 2.95  1.88  4.16   Sodium 135 - 145 mmol/L 136  140  141   Potassium 3.5 - 5.1 mmol/L 4.0  4.4  4.5   Chloride 98 - 111 mmol/L 104  104  108   CO2 22 - 32 mmol/L 22  30  28    Calcium 8.9 - 10.3 mg/dL 8.9  9.8  9.7   Total Protein 6.5 - 8.1 g/dL 7.3  7.1  6.8   Total Bilirubin 0.3 - 1.2 mg/dL 0.5  0.5  0.4   Alkaline Phos 38 - 126 U/L 53  54  58   AST 15 - 41 U/L 25  19  18    ALT 0 - 44 U/L 32  22  20       Latest Ref Rng & Units 11/01/2022   12:11 PM 09/25/2022   10:12 AM 09/24/2021   10:04 AM  CBC  WBC 4.0 - 10.5 K/uL 14.2  14.0  12.1   Hemoglobin 13.0 - 17.0 g/dL 60.6  30.1  60.1   Hematocrit 39.0 - 52.0 % 41.1  43.4  50.3   Platelets 150 - 400 K/uL 294  323  302      External labs:   Cholesterol, total 128.000 m 06/13/2022 HDL 44.000 mg 06/13/2022 LDL 58.000 mg 06/13/2022 Triglycerides 157.000 m 06/13/2022  TSH 1.730 06/11/2019  Radiology:   LDCT for lung cancer screening 03/19/2022:  FINDINGS: Cardiovascular: Normal heart size. No pericardial effusion. Normal caliber thoracic aorta with moderate atherosclerotic disease. Severe coronary artery calcifications. Mitral annular calcifications.  Cardiac Studies:   Exercise nuclear stress test 04/22/2022 Myocardial perfusion is normal. Overall LV systolic function is normal without regional wall motion abnormalities. Stress LV EF: 57%. Low risk study. Normal ECG stress. The patient exercised for 9 minutes and 45 seconds of a Bruce protocol, achieving approximately 11.22 METs and 94% MPHR. The heart rate response was normal. The blood pressure response was normal. No previous exam available for comparison.  Echocardiogram 04/11/2022:  Normal LV systolic function with visual EF 60-65%. Left ventricle cavity is normal in size. Mild concentric hypertrophy of the left ventricle. Normal global wall motion. Doppler evidence of grade I (impaired) diastolic dysfunction, normal LAP. Calculated EF 71%. Left atrial cavity is moderately dilated at 37.5 ml/m^2. Structurally normal tricuspid valve with no regurgitation. No evidence of pulmonary hypertension. No prior available for comparison.  US soft tissue neck 11/30/2022: 1. No sonographic correlate for patient's area of discomfort involving the anterolateral aspect of the right-side of the neck. 2. Moderate amount of atherosclerotic plaque within the bilateral carotid bulbs, right subjectively greater left. Further evaluation with carotid Doppler ultrasound could be performed as indicated.  EKG:   EKG 12/04/2022: Normal sinus rhythm at rate of 52 bpm, incomplete right bundle branch block.  Normal EKG.  No change from 04/08/2022.  Assessment     ICD-10-CM    1. Essential hypertension  I10 amLODipine (NORVASC) 10 MG tablet    losartan (COZAAR) 25 MG tablet    2. Coronary artery calcification seen on CT scan  I25.10 EKG 12-Lead    3. Mixed hyperlipidemia  E78.2     4. Atherosclerosis of both carotid arteries  I65.23 losartan (COZAAR) 25 MG tablet    CANCELED: Carotid    5. Tobacco use  Z72.0        Orders Placed This Encounter  Procedures   EKG 12-Lead    Meds ordered this encounter  Medications   amLODipine (NORVASC) 10 MG tablet    Sig: Take 1 tablet (10 mg total) by mouth daily.    Dispense:  90 tablet    Refill:  0    Refills to Duane Lope, MD   losartan (COZAAR) 25 MG tablet    Sig: Take 1 tablet (25 mg total) by mouth every evening.    Dispense:  90 tablet    Refill:  0    Refills to Dr. Duane Lope    Medications Discontinued During This Encounter  Medication Reason   amoxicillin-clavulanate (AUGMENTIN) 875-125 MG tablet Completed Course   Investigational - Study Medication    Vedolizumab (ENTYVIO IV)    amLODipine (NORVASC) 5 MG tablet Reorder     Recommendations:   Zachary Oneal is a 72 y.o.  male with past medical history significant for hypertension, hyperlipidemia, crohn's disease and COPD with ongoing tobacco use disorder, recent carotid ultrasound in August 2024 revealing significant amount of carotid atherosclerosis right worse than the left who is here for a 55-month follow-up visit.  1. Coronary artery calcification seen on CT scan Patient is presently on appropriate medical  therapy with regard to cardiac risk stratification including statin, aspirin, beta-blocker therapy on a low-dose as he has underlying sinus bradycardia and remains asymptomatic.  He has had excellent exercise tolerance reviewing his recently performed nuclear stress test.  He is low risk from that regard. - EKG 12-Lead  2. Essential hypertension Blood pressure was slightly elevated today, I have increased the dose of the amlodipine to  10 mg daily and as he has vascular disease including carotid atherosclerosis and significant carotid and coronary calcification and aortic calcification, will also add losartan for cardiovascular protection as well.  - amLODipine (NORVASC) 10 MG tablet; Take 1 tablet (10 mg total) by mouth daily.  Dispense: 90 tablet; Refill: 0 - Losartan 25 mg daily 3. Mixed hyperlipidemia Lipids are well-controlled, he is on rosuvastatin 20 mg daily, continue the same.  4. Atherosclerosis of both carotid arteries I reviewed his ultrasound of the neck that was recently performed, he has already been scheduled for carotid artery duplex, patient will inform me once it is done so I can follow-up on this.  Continue primary/secondary prevention is indicated.  5. Tobacco use We have discussed regarding smoking cessation finally appears to be motivated to quit smoking.  Other orders - aspirin 81 MG chewable tablet; Chew 81 mg by mouth daily.  As he is on appropriate medical therapy and is cardiac risk stratification is low overall at the present moment, if he were to quit smoking his overall outcomes would be significantly improved.  Otherwise he is on appropriate medical therapy and medical management, I will see him back on a as needed basis and we will request his PCP to take over all the prescriptions.  He will certainly contact us if he notices worsening dyspnea, chest pain, palpitations.    Zachary Decamp, MD, [facility] 12/04/2022, 11:53 AM Office: [phone] Fax: [phone] Pager: [phone]

## 2022-12-10 ENCOUNTER — Encounter: Payer: Self-pay | Admitting: Surgery

## 2022-12-10 ENCOUNTER — Ambulatory Visit: Payer: Self-pay | Admitting: Surgery

## 2022-12-10 DIAGNOSIS — D84821 Immunodeficiency due to drugs: Secondary | ICD-10-CM | POA: Diagnosis not present

## 2022-12-10 DIAGNOSIS — Z720 Tobacco use: Secondary | ICD-10-CM | POA: Diagnosis not present

## 2022-12-10 DIAGNOSIS — R739 Hyperglycemia, unspecified: Secondary | ICD-10-CM

## 2022-12-10 DIAGNOSIS — K50014 Crohn's disease of small intestine with abscess: Secondary | ICD-10-CM | POA: Diagnosis not present

## 2022-12-10 DIAGNOSIS — K50013 Crohn's disease of small intestine with fistula: Principal | ICD-10-CM | POA: Diagnosis present

## 2022-12-10 DIAGNOSIS — Z79899 Other long term (current) drug therapy: Secondary | ICD-10-CM | POA: Diagnosis not present

## 2022-12-11 ENCOUNTER — Ambulatory Visit
Admission: RE | Admit: 2022-12-11 | Discharge: 2022-12-11 | Disposition: A | Discharge status: Home / Self Care | Payer: Medicare Other | Source: Ambulatory Visit | Attending: Family Medicine | Admitting: Family Medicine

## 2022-12-11 DIAGNOSIS — I6523 Occlusion and stenosis of bilateral carotid arteries: Secondary | ICD-10-CM | POA: Diagnosis not present

## 2022-12-12 DIAGNOSIS — K50013 Crohn's disease of small intestine with fistula: Secondary | ICD-10-CM | POA: Diagnosis not present

## 2022-12-12 DIAGNOSIS — K50014 Crohn's disease of small intestine with abscess: Secondary | ICD-10-CM | POA: Diagnosis not present

## 2022-12-13 DIAGNOSIS — Z79899 Other long term (current) drug therapy: Secondary | ICD-10-CM | POA: Diagnosis not present

## 2022-12-13 DIAGNOSIS — K5000 Crohn's disease of small intestine without complications: Secondary | ICD-10-CM | POA: Diagnosis not present

## 2022-12-16 ENCOUNTER — Other Ambulatory Visit: Payer: Self-pay | Admitting: Urology

## 2022-12-16 DIAGNOSIS — D84821 Immunodeficiency due to drugs: Secondary | ICD-10-CM | POA: Diagnosis not present

## 2022-12-16 DIAGNOSIS — Z79899 Other long term (current) drug therapy: Secondary | ICD-10-CM | POA: Diagnosis not present

## 2022-12-16 DIAGNOSIS — K50013 Crohn's disease of small intestine with fistula: Secondary | ICD-10-CM | POA: Diagnosis not present

## 2022-12-16 DIAGNOSIS — K50014 Crohn's disease of small intestine with abscess: Secondary | ICD-10-CM | POA: Diagnosis not present

## 2022-12-17 DIAGNOSIS — R351 Nocturia: Secondary | ICD-10-CM | POA: Diagnosis not present

## 2022-12-17 DIAGNOSIS — E782 Mixed hyperlipidemia: Secondary | ICD-10-CM | POA: Diagnosis not present

## 2022-12-17 DIAGNOSIS — M549 Dorsalgia, unspecified: Secondary | ICD-10-CM | POA: Diagnosis not present

## 2022-12-17 DIAGNOSIS — Z6825 Body mass index (BMI) 25.0-25.9, adult: Secondary | ICD-10-CM | POA: Diagnosis not present

## 2022-12-18 ENCOUNTER — Other Ambulatory Visit (HOSPITAL_COMMUNITY): Payer: Self-pay | Admitting: Gastroenterology

## 2022-12-18 DIAGNOSIS — Z8719 Personal history of other diseases of the digestive system: Secondary | ICD-10-CM

## 2022-12-18 DIAGNOSIS — R109 Unspecified abdominal pain: Secondary | ICD-10-CM

## 2022-12-18 DIAGNOSIS — L988 Other specified disorders of the skin and subcutaneous tissue: Secondary | ICD-10-CM

## 2022-12-18 DIAGNOSIS — L0291 Cutaneous abscess, unspecified: Secondary | ICD-10-CM

## 2022-12-26 DIAGNOSIS — C652 Malignant neoplasm of left renal pelvis: Secondary | ICD-10-CM | POA: Diagnosis not present

## 2023-01-01 DIAGNOSIS — Z23 Encounter for immunization: Secondary | ICD-10-CM | POA: Diagnosis not present

## 2023-01-19 NOTE — Progress Notes (Signed)
COVID Vaccine received:  []  No [x]  Yes Date of any COVID positive Test in last 90 days:  PCP - Daisy Floro, MD  Cardiologist - Yates Decamp, MD   Cardiac Clearance in 12-04-2022 Epic note Hematology- Arlan Organ, MD  Chest x-ray - CT chest/Lung 03-14-2022  EPic EKG -  12-04-2022  Epic Stress Test - Eugenie Birks  04-22-2022  Epic ECHO - 04-11-2022  Epic Cardiac Cath -   PCR screen: []  Ordered & Completed [x]   No Order but Needs PROFEND     []   N/A for this surgery  Surgery Plan:  []  Ambulatory   []  Outpatient in bed  [x]  Admit Anesthesia:    [x]  General  []  Spinal  []   Choice []   MAC  Bowel Prep - []  No  [x]   Yes __CCS prep  Pacemaker / ICD device [x]  No []  Yes   Spinal Cord Stimulator:[x]  No []  Yes       History of Sleep Apnea? [x]  No []  Yes   CPAP used?- [x]  No []  Yes    Does the patient monitor blood sugar?   [x]  N/A   []  No []  Yes  Patient has: [x]  NO Hx DM   []  Pre-DM   []  DM1  []   DM2 Last A1c was:        on       Blood Thinner / Instructions:  None Aspirin Instructions:  ASA 81 mg  ERAS Protocol Ordered: []  No  [x]  Yes PRE-SURGERY [x]  ENSURE  X3   []  G2  Patient is to be NPO after: 1130  Dental hx: []  Dentures:  []  N/A      []  Bridge or Partial:                   []  Loose or Damaged teeth:   Comments:   Activity level: Patient is able / unable to climb a flight of stairs without difficulty; []  No CP  []  No SOB, but would have ___   Patient can / can not perform ADLs without assistance.   Anesthesia review: HTN, Smoker, COPD, GAD, Leukocytosis, CAD  Patient denies shortness of breath, fever, cough and chest pain at PAT appointment.  Patient verbalized understanding and agreement to the Pre-Surgical Instructions that were given to them at this PAT appointment. Patient was also educated of the need to review these PAT instructions again prior to his surgery.I reviewed the appropriate phone numbers to call if they have any and questions or concerns.

## 2023-01-19 NOTE — Patient Instructions (Addendum)
SURGICAL WAITING ROOM VISITATION Patients having surgery or a procedure may have no more than 2 support people in the waiting area - these visitors may rotate in the visitor waiting room.   Due to an increase in RSV and influenza rates and associated hospitalizations, children ages 24 and under may not visit patients in [facility] hospitals. If the patient needs to stay at the hospital during part of their recovery, the visitor guidelines for inpatient rooms apply.  PRE-OP VISITATION  Pre-op nurse will coordinate an appropriate time for 1 support person to accompany the patient in pre-op.  This support person may not rotate.  This visitor will be contacted when the time is appropriate for the visitor to come back in the pre-op area.  Please refer to the [facility] website for the visitor guidelines for Inpatients (after your surgery is over and you are in a regular room).  You are not required to quarantine at this time prior to your surgery. However, you must do this: Hand Hygiene often Do NOT share personal items Notify your provider if you are in close contact with someone who has COVID or you develop fever 100.4 or greater, new onset of sneezing, cough, sore throat, shortness of breath or body aches.  If you test positive for Covid or have been in contact with anyone that has tested positive in the last 10 days please notify you surgeon.    Your procedure is scheduled on:  THURSDAY  January 30, 2023  Report to [facility] Main Entrance: [facility]sen entrance where the [employer] is available.   Report to admitting at: 12:15  PM  Call this number if you have any questions or problems the morning of surgery [phone]  FOLLOW ANY ADDITIONAL PRE OP INSTRUCTIONS YOU RECEIVED FROM YOUR SURGEON'S OFFICE!!!  Dulcolax 20 mg (total) - Take 4 (four) of the 5 mg Dulcolax tablets with water at 07:00 am the day prior to surgery.  Miralax 255 g - Mix with 64 oz Gatorade/Powerade.   Starting at 10:00 am ,Drink this gradually over the next few hours (8 oz glass every 15-30 minutes) until gone the day prior to surgery You should finish in 4 hours-6 hours.    Neomycin 1000 mg - At 2 pm, 3 pm and 10 pm after Miralax  bowel prep the day prior to surgery.  Metronidazole 1000 mg - At 2 pm, 3 pm and 10 pm after Miralax bowel prep the day prior to surgery.   Drink plenty of clear liquids all evening to avoid getting dehydrated.   DRINK two (2) bottles of Pre-Surgery Clear Ensure  drink starting at 6:00 pm the evening prior to your surgery to help prevent dehydration. Increase drinking clear fluids (see list below)          Do not eat food after Midnight the night prior to your surgery/procedure.  After Midnight you may have the following liquids until   11:30 AM DAY OF SURGERY  Clear Liquid Diet Water Black Coffee (sugar ok, NO MILK/CREAM OR CREAMERS)  Tea (sugar ok, NO MILK/CREAM OR CREAMERS) regular and decaf                             Plain Jell-O  with no fruit (NO RED)  Fruit ices (not with fruit pulp, NO RED)                                     Popsicles (NO RED)                                                                  Juice: NO CITRUS JUICES: only apple, WHITE grape, WHITE cranberry Sports drinks like Gatorade or Powerade (NO RED)                   The day of surgery:  Drink ONE (1) Pre-Surgery Clear Ensure at 11:30   AM the morning of surgery. Drink in one sitting. Do not sip.  This drink was given to you during your hospital pre-op appointment visit. Nothing else to drink after completing the Pre-Surgery Clear Ensure  : No candy, chewing gum or throat lozenges.      Oral Hygiene is also important to reduce your risk of infection.        Remember - BRUSH YOUR TEETH THE MORNING OF SURGERY WITH YOUR REGULAR TOOTHPASTE  Do NOT smoke after Midnight the night before surgery.  STOP TAKING all Vitamins, Herbs and  supplements 1 week before your surgery.   Take ONLY these medicines the morning of surgery with A SIP OF WATER: amlodipine, Bisoprolol (Zebeta), citalopram (Celexa).  You may take Hydroxyzine (Atarax) if needed for anxiety.  You may use your Flonase nasal spray and Dulera inhaler if needed.                    You may not have any metal on your body including  jewelry, and body piercing  Do not wear  lotions, powders,  cologne, or deodorant  Men may shave face and neck.  Contacts, Hearing Aids, dentures or bridgework may not be worn into surgery. DENTURES WILL BE REMOVED PRIOR TO SURGERY PLEASE DO NOT APPLY "Poly grip" OR ADHESIVES!!!  You may bring a small overnight bag with you on the day of surgery, only pack items that are not valuable. [facility] IS NOT RESPONSIBLE   FOR VALUABLES THAT ARE LOST OR STOLEN.    Do not bring your home medications to the hospital. The Pharmacy will dispense medications listed on your medication list to you during your admission in the Hospital.  Special Instructions: Bring a copy of your healthcare power of attorney and living will documents the day of surgery, if you wish to have them scanned into your [facility]- EPIC  Please read over the following fact sheets you were given: IF YOU HAVE QUESTIONS ABOUT YOUR PRE-OP INSTRUCTIONS, PLEASE CALL [phone]   [facility] - Preparing for Surgery Before surgery, you can play an important role.  Because skin is not sterile, your skin needs to be as free of germs as possible.  You can reduce the number of germs on your skin by washing with CHG (chlorahexidine gluconate) soap before surgery.  CHG is an antiseptic cleaner which kills germs and bonds with the skin to continue killing germs even after washing. Please DO NOT use if you have an allergy to CHG or antibacterial soaps.  If your skin becomes reddened/irritated stop using the CHG and inform your nurse when you arrive at Short Stay. Do not  shave (including legs and underarms) for at least 48 hours prior to the first CHG shower.  You may shave your face/neck.  Please follow these instructions carefully:  1.  Shower with CHG Soap the night before surgery and the  morning of surgery.  2.  If you choose to wash your hair, wash your hair first as usual with your normal  shampoo.  3.  After you shampoo, rinse your hair and body thoroughly to remove the shampoo.                             4.  Use CHG as you would any other liquid soap.  You can apply chg directly to the skin and wash.  Gently with a scrungie or clean washcloth.  5.  Apply the CHG Soap to your body ONLY FROM THE NECK DOWN.   Do not use on face/ open                           Wound or open sores. Avoid contact with eyes, ears mouth and genitals (private parts).                       Wash face,  Genitals (private parts) with your normal soap.             6.  Wash thoroughly, paying special attention to the area where your  surgery  will be performed.  7.  Thoroughly rinse your body with warm water from the neck down.  8.  DO NOT shower/wash with your normal soap after using and rinsing off the CHG Soap.            9.  Pat yourself dry with a clean towel.            10.  Wear clean pajamas.            11.  Place clean sheets on your bed the night of your first shower and do not  sleep with pets.  ON THE DAY OF SURGERY : Do not apply any lotions/deodorants the morning of surgery.  Please wear clean clothes to the hospital/surgery center.     FAILURE TO FOLLOW THESE INSTRUCTIONS MAY RESULT IN THE CANCELLATION OF YOUR SURGERY  PATIENT SIGNATURE_________________________________  NURSE SIGNATURE__________________________________  ________________________________________________________________________    Rogelia Mire    An incentive spirometer is a tool that can help keep your lungs clear and active. This tool measures how well you are filling your lungs  with each breath. Taking long deep breaths may help reverse or decrease the chance of developing breathing (pulmonary) problems (especially infection) following: A long period of time when you are unable to move or be active. BEFORE THE PROCEDURE  If the spirometer includes an indicator to show your best effort, your nurse or respiratory therapist will set it to a desired goal. If possible, sit up straight or lean slightly forward. Try not to slouch. Hold the incentive spirometer in an upright position. INSTRUCTIONS FOR USE  Sit on the edge of your bed if possible, or sit up as far as you can in bed or on a chair. Hold the incentive spirometer in an upright position. Breathe out normally. Place the mouthpiece in your mouth and  seal your lips tightly around it. Breathe in slowly and as deeply as possible, raising the piston or the ball toward the top of the column. Hold your breath for 3-5 seconds or for as long as possible. Allow the piston or ball to fall to the bottom of the column. Remove the mouthpiece from your mouth and breathe out normally. Rest for a few seconds and repeat Steps 1 through 7 at least 10 times every 1-2 hours when you are awake. Take your time and take a few normal breaths between deep breaths. The spirometer may include an indicator to show your best effort. Use the indicator as a goal to work toward during each repetition. After each set of 10 deep breaths, practice coughing to be sure your lungs are clear. If you have an incision (the cut made at the time of surgery), support your incision when coughing by placing a pillow or rolled up towels firmly against it. Once you are able to get out of bed, walk around indoors and cough well. You may stop using the incentive spirometer when instructed by your caregiver.  RISKS AND COMPLICATIONS Take your time so you do not get dizzy or light-headed. If you are in pain, you may need to take or ask for pain medication before doing  incentive spirometry. It is harder to take a deep breath if you are having pain. AFTER USE Rest and breathe slowly and easily. It can be helpful to keep track of a log of your progress. Your caregiver can provide you with a simple table to help with this. If you are using the spirometer at home, follow these instructions: SEEK MEDICAL CARE IF:  You are having difficultly using the spirometer. You have trouble using the spirometer as often as instructed. Your pain medication is not giving enough relief while using the spirometer. You develop fever of 100.5 F (38.1 C) or higher.                                                                                                    SEEK IMMEDIATE MEDICAL CARE IF:  You cough up bloody sputum that had not been present before. You develop fever of 102 F (38.9 C) or greater. You develop worsening pain at or near the incision site. MAKE SURE YOU:  Understand these instructions. Will watch your condition. Will get help right away if you are not doing well or get worse. Document Released: 07/22/2006 Document Revised: 06/03/2011 Document Reviewed: 09/22/2006 [facility] Patient Information 2014 [place], [place].     WHAT IS A BLOOD TRANSFUSION? Blood Transfusion Information  A transfusion is the replacement of blood or some of its parts. Blood is made up of multiple cells which provide different functions. Red blood cells carry oxygen and are used for blood loss replacement. White blood cells fight against infection. Platelets control bleeding. Plasma helps clot blood. Other blood products are available for specialized needs, such as hemophilia or other clotting disorders. BEFORE THE TRANSFUSION  Who gives blood for transfusions?  Healthy volunteers who are fully evaluated to make sure their blood  is safe. This is blood bank blood. Transfusion therapy is the safest it has ever been in the practice of medicine. Before blood is taken from a donor, a  complete history is taken to make sure that person has no history of diseases nor engages in risky social behavior (examples are intravenous drug use or sexual activity with multiple partners). The donor's travel history is screened to minimize risk of transmitting infections, such as malaria. The donated blood is tested for signs of infectious diseases, such as HIV and hepatitis. The blood is then tested to be sure it is compatible with you in order to minimize the chance of a transfusion reaction. If you or a relative donates blood, this is often done in anticipation of surgery and is not appropriate for emergency situations. It takes many days to process the donated blood. RISKS AND COMPLICATIONS Although transfusion therapy is very safe and saves many lives, the main dangers of transfusion include:  Getting an infectious disease. Developing a transfusion reaction. This is an allergic reaction to something in the blood you were given. Every precaution is taken to prevent this. The decision to have a blood transfusion has been considered carefully by your caregiver before blood is given. Blood is not given unless the benefits outweigh the risks. AFTER THE TRANSFUSION Right after receiving a blood transfusion, you will usually feel much better and more energetic. This is especially true if your red blood cells have gotten low (anemic). The transfusion raises the level of the red blood cells which carry oxygen, and this usually causes an energy increase. The nurse administering the transfusion will monitor you carefully for complications. HOME CARE INSTRUCTIONS  No special instructions are needed after a transfusion. You may find your energy is better. Speak with your caregiver about any limitations on activity for underlying diseases you may have. SEEK MEDICAL CARE IF:  Your condition is not improving after your transfusion. You develop redness or irritation at the intravenous (IV) site. SEEK  IMMEDIATE MEDICAL CARE IF:  Any of the following symptoms occur over the next 12 hours: Shaking chills. You have a temperature by mouth above 102 F (38.9 C), not controlled by medicine. Chest, back, or muscle pain. People around you feel you are not acting correctly or are confused. Shortness of breath or difficulty breathing. Dizziness and fainting. You get a rash or develop hives. You have a decrease in urine output. Your urine turns a dark color or changes to pink, red, or brown. Any of the following symptoms occur over the next 10 days: You have a temperature by mouth above 102 F (38.9 C), not controlled by medicine. Shortness of breath. Weakness after normal activity. The white part of the eye turns yellow (jaundice). You have a decrease in the amount of urine or are urinating less often. Your urine turns a dark color or changes to pink, red, or brown. Document Released: 03/08/2000 Document Revised: 06/03/2011 Document Reviewed: 10/26/2007 [facility] Patient Information 2014 [place], [place].  _______________________________________________________________________

## 2023-01-20 ENCOUNTER — Ambulatory Visit (HOSPITAL_COMMUNITY)
Admission: RE | Admit: 2023-01-20 | Discharge: 2023-01-20 | Disposition: A | Discharge status: Home / Self Care | Payer: Medicare Other | Source: Ambulatory Visit | Attending: Gastroenterology | Admitting: Gastroenterology

## 2023-01-20 DIAGNOSIS — L988 Other specified disorders of the skin and subcutaneous tissue: Secondary | ICD-10-CM

## 2023-01-20 DIAGNOSIS — L0291 Cutaneous abscess, unspecified: Secondary | ICD-10-CM | POA: Diagnosis not present

## 2023-01-20 DIAGNOSIS — R109 Unspecified abdominal pain: Secondary | ICD-10-CM

## 2023-01-20 DIAGNOSIS — Z8719 Personal history of other diseases of the digestive system: Secondary | ICD-10-CM | POA: Diagnosis not present

## 2023-01-20 DIAGNOSIS — K4090 Unilateral inguinal hernia, without obstruction or gangrene, not specified as recurrent: Secondary | ICD-10-CM | POA: Diagnosis not present

## 2023-01-20 MED ORDER — IOHEXOL 350 MG/ML SOLN
75.0000 mL | Freq: Once | INTRAVENOUS | Status: AC | PRN
  Administered 2023-01-20: 75 mL via INTRAVENOUS

## 2023-01-20 MED ORDER — IOHEXOL 9 MG/ML PO SOLN
1000.0000 mL | Freq: Once | ORAL | Status: AC | PRN
  Administered 2023-01-20: 1000 mL via ORAL

## 2023-01-21 ENCOUNTER — Other Ambulatory Visit: Payer: Self-pay

## 2023-01-21 ENCOUNTER — Encounter (HOSPITAL_COMMUNITY)
Admission: RE | Admit: 2023-01-21 | Discharge: 2023-01-21 | Disposition: A | Discharge status: Home / Self Care | Payer: Medicare Other | Source: Ambulatory Visit | Attending: Surgery | Admitting: Surgery

## 2023-01-21 ENCOUNTER — Encounter (HOSPITAL_COMMUNITY): Payer: Self-pay

## 2023-01-21 VITALS — BP 133/63 | HR 50 | Temp 97.9°F | Resp 16 | Ht 73.0 in | Wt 200.0 lb

## 2023-01-21 DIAGNOSIS — I10 Essential (primary) hypertension: Secondary | ICD-10-CM | POA: Insufficient documentation

## 2023-01-21 DIAGNOSIS — Z01812 Encounter for preprocedural laboratory examination: Secondary | ICD-10-CM | POA: Diagnosis not present

## 2023-01-21 DIAGNOSIS — R739 Hyperglycemia, unspecified: Secondary | ICD-10-CM | POA: Insufficient documentation

## 2023-01-21 DIAGNOSIS — Z01818 Encounter for other preprocedural examination: Secondary | ICD-10-CM | POA: Diagnosis present

## 2023-01-21 HISTORY — DX: Gastro-esophageal reflux disease without esophagitis: K21.9

## 2023-01-21 HISTORY — DX: Atherosclerotic heart disease of native coronary artery without angina pectoris: I25.10

## 2023-01-21 HISTORY — DX: Anxiety disorder, unspecified: F41.9

## 2023-01-21 HISTORY — DX: Unspecified osteoarthritis, unspecified site: M19.90

## 2023-01-21 HISTORY — DX: Elevated white blood cell count, unspecified: D72.829

## 2023-01-21 LAB — CBC
HCT: 47.3 % (ref 39.0–52.0)
Hemoglobin: 15.6 g/dL (ref 13.0–17.0)
MCH: 30.7 pg (ref 26.0–34.0)
MCHC: 33 g/dL (ref 30.0–36.0)
MCV: 93.1 fL (ref 80.0–100.0)
Platelets: 316 10*3/uL (ref 150–400)
RBC: 5.08 MIL/uL (ref 4.22–5.81)
RDW: 14.5 % (ref 11.5–15.5)
WBC: 17.6 10*3/uL — ABNORMAL HIGH (ref 4.0–10.5)
nRBC: 0 % (ref 0.0–0.2)

## 2023-01-21 LAB — BASIC METABOLIC PANEL
Anion gap: 10 (ref 5–15)
BUN: 21 mg/dL (ref 8–23)
CO2: 22 mmol/L (ref 22–32)
Calcium: 9.3 mg/dL (ref 8.9–10.3)
Chloride: 104 mmol/L (ref 98–111)
Creatinine, Ser: 0.94 mg/dL (ref 0.61–1.24)
GFR, Estimated: >60 mL/min (ref >60)
Glucose, Bld: 111 mg/dL — ABNORMAL HIGH (ref 70–99)
Potassium: 4.3 mmol/L (ref 3.5–5.1)
Sodium: 136 mmol/L (ref 135–145)

## 2023-01-21 LAB — HEMOGLOBIN A1C
Hgb A1c MFr Bld: 6.3 % — ABNORMAL HIGH (ref 4.8–5.6)
Mean Plasma Glucose: 134.11 mg/dL

## 2023-01-21 NOTE — Consult Note (Addendum)
WOC Nurse requested for preoperative stoma site marking; explained to the patient "this is just in case" it is needed during surgery.   Discussed surgical procedure and stoma creation with patient.  Explained role of the WOC nurse team.  Provided the patient with educational booklet and provided samples of pouching options. Answered patient's questions.   Examined patient sitting, and standing in order to place the marking in the patient's visual field, away from any creases or abdominal contour issues and within the rectus muscle.  Attempted to mark below the patient's belt line, but this was not possible since a significant crease occurs above and below the selected sites which should be avoided if possible.    Marked for colostomy in the LLQ  __3__ cm to the left of the umbilicus and __6.5__cm above the umbilicus.  Marked for ileostomy in the RLQ  __3__cm to the right of the umbilicus and  _6.5___ cm above the umbilicus.  Patient's abdomen cleansed with CHG wipes at site markings, allowed to air dry prior to marking.Covered mark with thin film transparent dressing to preserve mark until date of surgery. Provided patient with marking pen and told him to re-color in the marks if they begin to fade prior to surgery.  WOC Nurse team will follow up with patient after surgery for continued ostomy care and teaching if he receives an ostomy. Thank-you,  Cammie Mcgee MSN, RN, CWOCN, [place], CNS [phone]

## 2023-01-22 DIAGNOSIS — Z7960 Long term (current) use of unspecified immunomodulators and immunosuppressants: Secondary | ICD-10-CM | POA: Diagnosis not present

## 2023-01-22 DIAGNOSIS — K50014 Crohn's disease of small intestine with abscess: Secondary | ICD-10-CM | POA: Diagnosis not present

## 2023-01-22 DIAGNOSIS — F17200 Nicotine dependence, unspecified, uncomplicated: Secondary | ICD-10-CM | POA: Diagnosis not present

## 2023-01-23 NOTE — Progress Notes (Signed)
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 smoker with h/o HTN, COPD, CAD, chron's disease of ileum with abscess/fistula scheduled for above procedure 01/30/2023 with Dr. Vilma Prader and Dr. Karie Soda.   Pt seen by cardiology 12/04/2022. Per OV note, "As he is on appropriate medical therapy and is cardiac risk stratification is low overall at the present moment, if he were to quit smoking his overall outcomes would be significantly improved. Otherwise he is on appropriate medical therapy and medical management, I will see him back on a as needed basis and we will request his PCP to take over all the prescriptions. He will certainly contact us if he notices worsening dyspnea, chest pain, palpitations. "  Low risk stress test 04/22/2022.  VS: BP 133/63   Pulse (!) 50   Temp 36.6 C (Oral)   Resp 16   Ht 6\' 1"  (1.854 m)   Wt 90.7 kg   SpO2 96%   BMI 26.39 kg/m   PROVIDERS: Daisy Floro, MD is PCP    LABS: Labs reviewed: Acceptable for surgery. (all labs ordered are listed, but only abnormal results are displayed)  Labs Reviewed  BASIC METABOLIC PANEL - Abnormal; Notable for the following components:      Result Value   Glucose, Bld 111 (*)    All other components within normal limits  CBC - Abnormal; Notable for the following components:   WBC 17.6 (*)    All other components within normal limits  HEMOGLOBIN A1C - Abnormal; Notable for the following components:   Hgb A1c MFr Bld 6.3 (*)    All other components within normal limits  TYPE AND SCREEN     IMAGES:   EKG:   CV: Echocardiogram  04/11/2022: Normal LV systolic function with visual EF 60-65%. Left ventricle cavity is normal in size. Mild concentric hypertrophy of the left ventricle. Normal global wall motion. Doppler evidence of grade I (impaired) diastolic dysfunction, normal LAP. Calculated EF 71%. Left atrial cavity is moderately dilated at 37.5 ml/m^2. Structurally normal tricuspid valve with no regurgitation. No evidence of pulmonary hypertension. No prior available for comparison.   Exercise nuclear stress test 04/22/2022 Myocardial perfusion is normal. Overall LV systolic function is normal without regional wall motion abnormalities. Stress LV EF: 57%. Low risk study. Normal ECG stress. The patient exercised for 9 minutes and 45 seconds of a Bruce protocol, achieving approximately 11.22 METs and 94% MPHR. The heart rate response was normal. The blood pressure response was normal. No previous exam available for comparison. Past Medical History:  Diagnosis Date   Anxiety    Arthritis    COPD (chronic obstructive pulmonary disease) (HCC)    Coronary artery disease    GERD (gastroesophageal reflux disease)    Hypertension    Leukocytosis    Dr. Myna Hidalgo sees him yearly   Research study patient: ACCLAIM-Lp(a)) clinical trial  10/14/2022 10/14/2022   Patient has randomized into the [place] on the Reduction of MACE w/ Elevated Lp(a) in Established CVD or High Risk for CVD (J3L-MC-EZEF-ACCLAIM-Lp(a)) clinical trial  10/14/2022  Ureteral mass    Wears glasses     Past Surgical History:  Procedure Laterality Date   CYSTOSCOPY WITH RETROGRADE PYELOGRAM, URETEROSCOPY AND STENT PLACEMENT Bilateral 12/06/2015   Procedure: CYSTOSCOPY WITH BILATERAL RETROGRADE PYELOGRAM, LEFT URETERAL BALLOON DILITATION, LEFT URETEROSCOPY AND LEFT URETERAL BIOPSY AND LEFT URETERAL STENT PLACEMENT;  Surgeon: Crist Fat, MD;  Location: [facility];  Service: Urology;  Laterality: Bilateral;   FRACTURE SURGERY  Right 1975   Right ankle   HERNIA REPAIR     Multiple inguinal and ventral repaired with mesh   TONSILLECTOMY     WISDOM TOOTH EXTRACTION      MEDICATIONS:  amLODipine (NORVASC) 10 MG tablet   aspirin 81 MG chewable tablet   B Complex Vitamins (VITAMIN B COMPLEX) CAPS   bisoprolol (ZEBETA) 5 MG tablet   Cholecalciferol (VITAMIN D3) 50 MCG (2000 UT) TABS   citalopram (CELEXA) 20 MG tablet   fluticasone (FLONASE) 50 MCG/ACT nasal spray   hydrOXYzine (ATARAX/VISTARIL) 10 MG tablet   losartan (COZAAR) 25 MG tablet   mometasone-formoterol (DULERA) 100-5 MCG/ACT AERO   Multiple Vitamins-Minerals (MULTIVITAMIN ADULTS 50+ PO)   rosuvastatin (CRESTOR) 20 MG tablet   traZODone (DESYREL) 50 MG tablet   No current facility-administered medications for this encounter.    Jodell Cipro Ward, PA-C [facility]-Surgical Testing [phone]

## 2023-01-27 ENCOUNTER — Other Ambulatory Visit: Payer: Self-pay | Admitting: Urology

## 2023-01-30 ENCOUNTER — Encounter (HOSPITAL_COMMUNITY)
Admission: RE | Disposition: A | Discharge status: Home / Self Care | Payer: Self-pay | Source: Home / Self Care | Attending: Surgery

## 2023-01-30 ENCOUNTER — Inpatient Hospital Stay (HOSPITAL_COMMUNITY): Payer: Medicare Other | Admitting: Anesthesiology

## 2023-01-30 ENCOUNTER — Encounter (HOSPITAL_COMMUNITY): Payer: Self-pay | Admitting: Surgery

## 2023-01-30 ENCOUNTER — Inpatient Hospital Stay (HOSPITAL_COMMUNITY)
Admission: RE | Admit: 2023-01-30 | Discharge: 2023-02-03 | DRG: 329 | Disposition: A | Discharge status: Home / Self Care | Payer: Medicare Other | Attending: Surgery | Admitting: Surgery

## 2023-01-30 ENCOUNTER — Other Ambulatory Visit: Payer: Self-pay

## 2023-01-30 ENCOUNTER — Inpatient Hospital Stay (HOSPITAL_COMMUNITY): Payer: Medicare Other | Admitting: Physician Assistant

## 2023-01-30 DIAGNOSIS — Z720 Tobacco use: Secondary | ICD-10-CM | POA: Diagnosis present

## 2023-01-30 DIAGNOSIS — Z886 Allergy status to analgesic agent status: Secondary | ICD-10-CM

## 2023-01-30 DIAGNOSIS — Z7982 Long term (current) use of aspirin: Secondary | ICD-10-CM | POA: Diagnosis not present

## 2023-01-30 DIAGNOSIS — D84821 Immunodeficiency due to drugs: Secondary | ICD-10-CM | POA: Diagnosis present

## 2023-01-30 DIAGNOSIS — K30 Functional dyspepsia: Secondary | ICD-10-CM | POA: Diagnosis present

## 2023-01-30 DIAGNOSIS — F17210 Nicotine dependence, cigarettes, uncomplicated: Secondary | ICD-10-CM | POA: Diagnosis present

## 2023-01-30 DIAGNOSIS — Z8551 Personal history of malignant neoplasm of bladder: Secondary | ICD-10-CM

## 2023-01-30 DIAGNOSIS — F419 Anxiety disorder, unspecified: Secondary | ICD-10-CM | POA: Diagnosis present

## 2023-01-30 DIAGNOSIS — K50014 Crohn's disease of small intestine with abscess: Secondary | ICD-10-CM | POA: Diagnosis present

## 2023-01-30 DIAGNOSIS — K50018 Crohn's disease of small intestine with other complication: Secondary | ICD-10-CM | POA: Diagnosis not present

## 2023-01-30 DIAGNOSIS — K50013 Crohn's disease of small intestine with fistula: Secondary | ICD-10-CM | POA: Diagnosis present

## 2023-01-30 DIAGNOSIS — E782 Mixed hyperlipidemia: Secondary | ICD-10-CM | POA: Diagnosis present

## 2023-01-30 DIAGNOSIS — K219 Gastro-esophageal reflux disease without esophagitis: Secondary | ICD-10-CM | POA: Diagnosis present

## 2023-01-30 DIAGNOSIS — M25519 Pain in unspecified shoulder: Secondary | ICD-10-CM | POA: Insufficient documentation

## 2023-01-30 DIAGNOSIS — Z885 Allergy status to narcotic agent status: Secondary | ICD-10-CM

## 2023-01-30 DIAGNOSIS — Z79899 Other long term (current) drug therapy: Secondary | ICD-10-CM

## 2023-01-30 DIAGNOSIS — F411 Generalized anxiety disorder: Secondary | ICD-10-CM | POA: Diagnosis present

## 2023-01-30 DIAGNOSIS — D176 Benign lipomatous neoplasm of spermatic cord: Secondary | ICD-10-CM | POA: Diagnosis present

## 2023-01-30 DIAGNOSIS — Z800 Family history of malignant neoplasm of digestive organs: Secondary | ICD-10-CM

## 2023-01-30 DIAGNOSIS — M1990 Unspecified osteoarthritis, unspecified site: Secondary | ICD-10-CM | POA: Diagnosis present

## 2023-01-30 DIAGNOSIS — I10 Essential (primary) hypertension: Secondary | ICD-10-CM | POA: Diagnosis present

## 2023-01-30 DIAGNOSIS — K3533 Acute appendicitis with perforation and localized peritonitis, with abscess: Secondary | ICD-10-CM | POA: Diagnosis present

## 2023-01-30 DIAGNOSIS — J449 Chronic obstructive pulmonary disease, unspecified: Secondary | ICD-10-CM | POA: Diagnosis present

## 2023-01-30 DIAGNOSIS — I2510 Atherosclerotic heart disease of native coronary artery without angina pectoris: Secondary | ICD-10-CM | POA: Diagnosis present

## 2023-01-30 DIAGNOSIS — Z408 Encounter for other prophylactic surgery: Secondary | ICD-10-CM | POA: Diagnosis not present

## 2023-01-30 DIAGNOSIS — K56699 Other intestinal obstruction unspecified as to partial versus complete obstruction: Secondary | ICD-10-CM | POA: Diagnosis present

## 2023-01-30 DIAGNOSIS — K6389 Other specified diseases of intestine: Secondary | ICD-10-CM | POA: Diagnosis not present

## 2023-01-30 DIAGNOSIS — K5939 Other megacolon: Secondary | ICD-10-CM | POA: Diagnosis not present

## 2023-01-30 DIAGNOSIS — K632 Fistula of intestine: Secondary | ICD-10-CM | POA: Diagnosis not present

## 2023-01-30 DIAGNOSIS — K5000 Crohn's disease of small intestine without complications: Secondary | ICD-10-CM | POA: Diagnosis not present

## 2023-01-30 LAB — TYPE AND SCREEN
ABO/RH(D): B POS
Antibody Screen: NEGATIVE

## 2023-01-30 LAB — ABO/RH: ABO/RH(D): B POS

## 2023-01-30 SURGERY — COLECTOMY, PARTIAL, ROBOT-ASSISTED, LAPAROSCOPIC
Anesthesia: General

## 2023-01-30 MED ORDER — DEXAMETHASONE SODIUM PHOSPHATE 10 MG/ML IJ SOLN
INTRAMUSCULAR | Status: DC | PRN
  Administered 2023-01-30: 5 mg via INTRAVENOUS

## 2023-01-30 MED ORDER — OXYCODONE HCL 5 MG PO TABS: 5.0000 mg | ORAL_TABLET | Freq: Once | ORAL | Status: DC | PRN

## 2023-01-30 MED ORDER — EPHEDRINE 5 MG/ML INJ
INTRAVENOUS | Status: AC
  Filled 2023-01-30: qty 5

## 2023-01-30 MED ORDER — ACETAMINOPHEN 500 MG PO TABS
1000.0000 mg | ORAL_TABLET | ORAL | Status: AC
Start: 2023-01-30 — End: 2023-01-30
  Administered 2023-01-30: 1000 mg via ORAL
  Filled 2023-01-30: qty 2

## 2023-01-30 MED ORDER — SALINE SPRAY 0.65 % NA SOLN: 1.0000 | Freq: Four times a day (QID) | NASAL | Status: DC | PRN

## 2023-01-30 MED ORDER — LOSARTAN POTASSIUM 25 MG PO TABS
25.0000 mg | ORAL_TABLET | Freq: Every evening | ORAL | Status: DC
  Administered 2023-01-30 – 2023-02-02 (×4): 25 mg via ORAL
  Filled 2023-01-30 (×4): qty 1

## 2023-01-30 MED ORDER — HYDROXYZINE HCL 10 MG PO TABS: 10.0000 mg | ORAL_TABLET | Freq: Every day | ORAL | Status: DC | PRN

## 2023-01-30 MED ORDER — ENSURE SURGERY PO LIQD
237.0000 mL | Freq: Two times a day (BID) | ORAL | Status: DC
  Administered 2023-02-01 – 2023-02-02 (×3): 237 mL via ORAL

## 2023-01-30 MED ORDER — FENTANYL CITRATE (PF) 100 MCG/2ML IJ SOLN
INTRAMUSCULAR | Status: AC
  Filled 2023-01-30: qty 2

## 2023-01-30 MED ORDER — METHOCARBAMOL 500 MG PO TABS
1000.0000 mg | ORAL_TABLET | Freq: Four times a day (QID) | ORAL | Status: DC | PRN
  Administered 2023-01-30 – 2023-01-31 (×2): 1000 mg via ORAL
  Filled 2023-01-30 (×2): qty 2

## 2023-01-30 MED ORDER — METRONIDAZOLE 500 MG PO TABS: 1000.0000 mg | ORAL_TABLET | ORAL | Status: DC

## 2023-01-30 MED ORDER — BUPIVACAINE-EPINEPHRINE (PF) 0.25% -1:200000 IJ SOLN
INTRAMUSCULAR | Status: DC | PRN
  Administered 2023-01-30: 50 mL via PERINEURAL

## 2023-01-30 MED ORDER — METHOCARBAMOL 1000 MG/10ML IJ SOLN: 1000.0000 mg | Freq: Four times a day (QID) | INTRAMUSCULAR | Status: DC | PRN

## 2023-01-30 MED ORDER — ENSURE PRE-SURGERY PO LIQD: 296.0000 mL | Freq: Once | ORAL | Status: DC

## 2023-01-30 MED ORDER — TRAMADOL HCL 50 MG PO TABS
50.0000 mg | ORAL_TABLET | Freq: Four times a day (QID) | ORAL | Status: DC | PRN
  Administered 2023-01-30 – 2023-02-01 (×4): 100 mg via ORAL
  Administered 2023-02-02: 50 mg via ORAL
  Administered 2023-02-02: 100 mg via ORAL
  Filled 2023-01-30 (×7): qty 2

## 2023-01-30 MED ORDER — FLUTICASONE PROPIONATE 50 MCG/ACT NA SUSP: 2.0000 | Freq: Every day | NASAL | Status: DC | PRN

## 2023-01-30 MED ORDER — ACETAMINOPHEN 500 MG PO TABS
1000.0000 mg | ORAL_TABLET | Freq: Four times a day (QID) | ORAL | Status: DC
  Administered 2023-01-30 – 2023-02-03 (×14): 1000 mg via ORAL
  Filled 2023-01-30 (×14): qty 2

## 2023-01-30 MED ORDER — BUPIVACAINE LIPOSOME 1.3 % IJ SUSP: 20.0000 mL | Freq: Once | INTRAMUSCULAR | Status: DC

## 2023-01-30 MED ORDER — SODIUM CHLORIDE 0.9 % IV SOLN
2.0000 g | Freq: Two times a day (BID) | INTRAVENOUS | Status: AC
  Administered 2023-01-30: 2 g via INTRAVENOUS
  Filled 2023-01-30: qty 2

## 2023-01-30 MED ORDER — SODIUM CHLORIDE 0.9 % IV SOLN: 250.0000 mL | INTRAVENOUS | Status: DC | PRN

## 2023-01-30 MED ORDER — PHENOL 1.4 % MT LIQD: 2.0000 | OROMUCOSAL | Status: DC | PRN

## 2023-01-30 MED ORDER — HYDRALAZINE HCL 20 MG/ML IJ SOLN: 10.0000 mg | INTRAMUSCULAR | Status: DC | PRN

## 2023-01-30 MED ORDER — SODIUM CHLORIDE 0.9% FLUSH: 3.0000 mL | INTRAVENOUS | Status: DC | PRN

## 2023-01-30 MED ORDER — MIDAZOLAM HCL 5 MG/5ML IJ SOLN
INTRAMUSCULAR | Status: DC | PRN
  Administered 2023-01-30: 2 mg via INTRAVENOUS

## 2023-01-30 MED ORDER — ONDANSETRON HCL 4 MG/2ML IJ SOLN
INTRAMUSCULAR | Status: DC | PRN
  Administered 2023-01-30: 4 mg via INTRAVENOUS

## 2023-01-30 MED ORDER — NEOMYCIN SULFATE 500 MG PO TABS: 1000.0000 mg | ORAL_TABLET | ORAL | Status: DC

## 2023-01-30 MED ORDER — STERILE WATER FOR INJECTION IJ SOLN
INTRAMUSCULAR | Status: AC
  Filled 2023-01-30: qty 10

## 2023-01-30 MED ORDER — PROCHLORPERAZINE MALEATE 10 MG PO TABS: 10.0000 mg | ORAL_TABLET | Freq: Four times a day (QID) | ORAL | Status: DC | PRN

## 2023-01-30 MED ORDER — BISOPROLOL FUMARATE 5 MG PO TABS
5.0000 mg | ORAL_TABLET | Freq: Every day | ORAL | Status: DC
  Administered 2023-01-31 – 2023-02-03 (×4): 5 mg via ORAL
  Filled 2023-01-30 (×4): qty 1

## 2023-01-30 MED ORDER — ASPIRIN 81 MG PO CHEW
81.0000 mg | CHEWABLE_TABLET | Freq: Every day | ORAL | Status: DC
  Administered 2023-01-30 – 2023-02-03 (×5): 81 mg via ORAL
  Filled 2023-01-30 (×5): qty 1

## 2023-01-30 MED ORDER — GLYCOPYRROLATE 0.2 MG/ML IJ SOLN
INTRAMUSCULAR | Status: DC | PRN
  Administered 2023-01-30: .2 mg via INTRAVENOUS

## 2023-01-30 MED ORDER — FENTANYL CITRATE (PF) 100 MCG/2ML IJ SOLN
INTRAMUSCULAR | Status: DC | PRN
  Administered 2023-01-30 (×2): 50 ug via INTRAVENOUS

## 2023-01-30 MED ORDER — TRAMADOL HCL 50 MG PO TABS: 50.0000 mg | ORAL_TABLET | Freq: Four times a day (QID) | ORAL | 0 refills | Status: DC | PRN

## 2023-01-30 MED ORDER — HYDROMORPHONE HCL 2 MG/ML IJ SOLN
INTRAMUSCULAR | Status: AC
  Filled 2023-01-30: qty 1

## 2023-01-30 MED ORDER — ONDANSETRON HCL 4 MG PO TABS: 4.0000 mg | ORAL_TABLET | Freq: Four times a day (QID) | ORAL | Status: DC | PRN

## 2023-01-30 MED ORDER — ROCURONIUM BROMIDE 100 MG/10ML IV SOLN
INTRAVENOUS | Status: DC | PRN
  Administered 2023-01-30: 30 mg via INTRAVENOUS
  Administered 2023-01-30: 60 mg via INTRAVENOUS

## 2023-01-30 MED ORDER — DIPHENHYDRAMINE HCL 50 MG/ML IJ SOLN: 12.5000 mg | Freq: Four times a day (QID) | INTRAMUSCULAR | Status: DC | PRN

## 2023-01-30 MED ORDER — LACTATED RINGERS IV SOLN: Freq: Three times a day (TID) | INTRAVENOUS | Status: AC | PRN

## 2023-01-30 MED ORDER — KETAMINE HCL 50 MG/5ML IJ SOSY
PREFILLED_SYRINGE | INTRAMUSCULAR | Status: AC
  Filled 2023-01-30: qty 5

## 2023-01-30 MED ORDER — LIDOCAINE HCL (CARDIAC) PF 100 MG/5ML IV SOSY
PREFILLED_SYRINGE | INTRAVENOUS | Status: DC | PRN
  Administered 2023-01-30: 100 mg via INTRAVENOUS

## 2023-01-30 MED ORDER — ALVIMOPAN 12 MG PO CAPS
12.0000 mg | ORAL_CAPSULE | ORAL | Status: AC
Start: 2023-01-30 — End: 2023-01-30
  Administered 2023-01-30: 12 mg via ORAL
  Filled 2023-01-30: qty 1

## 2023-01-30 MED ORDER — MOMETASONE FURO-FORMOTEROL FUM 100-5 MCG/ACT IN AERO
2.0000 | INHALATION_SPRAY | Freq: Two times a day (BID) | RESPIRATORY_TRACT | Status: DC
  Administered 2023-01-31 – 2023-02-01 (×5): 2 via RESPIRATORY_TRACT
  Filled 2023-01-30: qty 8.8

## 2023-01-30 MED ORDER — ORAL CARE MOUTH RINSE: 15.0000 mL | Freq: Once | OROMUCOSAL | Status: AC

## 2023-01-30 MED ORDER — GABAPENTIN 100 MG PO CAPS
200.0000 mg | ORAL_CAPSULE | ORAL | Status: AC
  Administered 2023-01-30: 200 mg via ORAL
  Filled 2023-01-30: qty 2

## 2023-01-30 MED ORDER — BUPIVACAINE LIPOSOME 1.3 % IJ SUSP
INTRAMUSCULAR | Status: AC
  Filled 2023-01-30: qty 20

## 2023-01-30 MED ORDER — OXYCODONE HCL 5 MG/5ML PO SOLN: 5.0000 mg | Freq: Once | ORAL | Status: DC | PRN

## 2023-01-30 MED ORDER — PROCHLORPERAZINE EDISYLATE 10 MG/2ML IJ SOLN: 5.0000 mg | Freq: Four times a day (QID) | INTRAMUSCULAR | Status: DC | PRN

## 2023-01-30 MED ORDER — VITAMIN D 25 MCG (1000 UNIT) PO TABS
2000.0000 [IU] | ORAL_TABLET | Freq: Every day | ORAL | Status: DC
Start: 2023-01-31 — End: 2023-02-03
  Administered 2023-01-31 – 2023-02-03 (×4): 2000 [IU] via ORAL
  Filled 2023-01-30 (×4): qty 2

## 2023-01-30 MED ORDER — LACTATED RINGERS IV SOLN: INTRAVENOUS | Status: DC | PRN

## 2023-01-30 MED ORDER — KETAMINE HCL 10 MG/ML IJ SOLN
INTRAMUSCULAR | Status: DC | PRN
  Administered 2023-01-30: 30 mg via INTRAVENOUS

## 2023-01-30 MED ORDER — BUPIVACAINE-EPINEPHRINE 0.25% -1:200000 IJ SOLN
INTRAMUSCULAR | Status: AC
  Filled 2023-01-30: qty 1

## 2023-01-30 MED ORDER — ALUM & MAG HYDROXIDE-SIMETH 200-200-20 MG/5ML PO SUSP
30.0000 mL | Freq: Four times a day (QID) | ORAL | Status: DC | PRN
  Administered 2023-01-30 – 2023-01-31 (×3): 30 mL via ORAL
  Filled 2023-01-30 (×3): qty 30

## 2023-01-30 MED ORDER — STERILE WATER FOR IRRIGATION IR SOLN
Status: DC | PRN
  Administered 2023-01-30: 1000 mL

## 2023-01-30 MED ORDER — MAGIC MOUTHWASH: 15.0000 mL | Freq: Four times a day (QID) | ORAL | Status: DC | PRN

## 2023-01-30 MED ORDER — KCL IN DEXTROSE-NACL 20-5-0.45 MEQ/L-%-% IV SOLN
INTRAVENOUS | Status: DC
  Filled 2023-01-30: qty 1000

## 2023-01-30 MED ORDER — ONDANSETRON HCL 4 MG/2ML IJ SOLN: 4.0000 mg | Freq: Four times a day (QID) | INTRAMUSCULAR | Status: DC | PRN

## 2023-01-30 MED ORDER — BUPIVACAINE LIPOSOME 1.3 % IJ SUSP
INTRAMUSCULAR | Status: DC | PRN
  Administered 2023-01-30: 20 mL

## 2023-01-30 MED ORDER — DIPHENHYDRAMINE HCL 12.5 MG/5ML PO ELIX
12.5000 mg | ORAL_SOLUTION | Freq: Four times a day (QID) | ORAL | Status: DC | PRN
  Administered 2023-01-30: 12.5 mg via ORAL
  Filled 2023-01-30: qty 5

## 2023-01-30 MED ORDER — MIDAZOLAM HCL 2 MG/2ML IJ SOLN
INTRAMUSCULAR | Status: AC
  Filled 2023-01-30: qty 2

## 2023-01-30 MED ORDER — CITALOPRAM HYDROBROMIDE 20 MG PO TABS
20.0000 mg | ORAL_TABLET | Freq: Every day | ORAL | Status: DC
  Administered 2023-01-31 – 2023-02-03 (×4): 20 mg via ORAL
  Filled 2023-01-30 (×4): qty 1

## 2023-01-30 MED ORDER — LACTATED RINGERS IR SOLN
Status: DC | PRN
  Administered 2023-01-30: 1000 mL

## 2023-01-30 MED ORDER — INDOCYANINE GREEN 25 MG IV SOLR
INTRAVENOUS | Status: DC | PRN
  Administered 2023-01-30: 6.25 mg via INTRAVENOUS

## 2023-01-30 MED ORDER — PHENYLEPHRINE HCL-NACL 20-0.9 MG/250ML-% IV SOLN
INTRAVENOUS | Status: DC | PRN
Start: 2023-01-30 — End: 2023-01-30
  Administered 2023-01-30: 20 ug/min via INTRAVENOUS

## 2023-01-30 MED ORDER — PROPOFOL 10 MG/ML IV BOLUS
INTRAVENOUS | Status: AC
  Filled 2023-01-30: qty 20

## 2023-01-30 MED ORDER — TRAZODONE 25 MG HALF TABLET: 25.0000 mg | ORAL_TABLET | Freq: Every evening | ORAL | Status: DC | PRN

## 2023-01-30 MED ORDER — METHOCARBAMOL 500 MG PO TABS: 1000.0000 mg | ORAL_TABLET | Freq: Four times a day (QID) | ORAL | Status: DC | PRN

## 2023-01-30 MED ORDER — SIMETHICONE 80 MG PO CHEW
40.0000 mg | CHEWABLE_TABLET | Freq: Four times a day (QID) | ORAL | Status: DC | PRN
  Administered 2023-01-30 – 2023-01-31 (×2): 40 mg via ORAL
  Filled 2023-01-30 (×2): qty 1

## 2023-01-30 MED ORDER — GLYCOPYRROLATE 0.2 MG/ML IJ SOLN
INTRAMUSCULAR | Status: AC
  Filled 2023-01-30: qty 1

## 2023-01-30 MED ORDER — ENOXAPARIN SODIUM 40 MG/0.4ML IJ SOSY
40.0000 mg | PREFILLED_SYRINGE | INTRAMUSCULAR | Status: DC
  Administered 2023-01-31 – 2023-02-03 (×4): 40 mg via SUBCUTANEOUS
  Filled 2023-01-30 (×4): qty 0.4

## 2023-01-30 MED ORDER — ROSUVASTATIN CALCIUM 20 MG PO TABS
20.0000 mg | ORAL_TABLET | Freq: Every day | ORAL | Status: DC
  Administered 2023-01-31 – 2023-02-03 (×4): 20 mg via ORAL
  Filled 2023-01-30 (×4): qty 1

## 2023-01-30 MED ORDER — MENTHOL 3 MG MT LOZG
1.0000 | LOZENGE | OROMUCOSAL | Status: DC | PRN
  Filled 2023-01-30: qty 9

## 2023-01-30 MED ORDER — BISACODYL 5 MG PO TBEC: 20.0000 mg | DELAYED_RELEASE_TABLET | Freq: Once | ORAL | Status: DC

## 2023-01-30 MED ORDER — HYDROMORPHONE HCL 1 MG/ML IJ SOLN
0.5000 mg | INTRAMUSCULAR | Status: DC | PRN
  Administered 2023-01-31: 1 mg via INTRAVENOUS
  Filled 2023-01-30 (×2): qty 1

## 2023-01-30 MED ORDER — PROPOFOL 10 MG/ML IV BOLUS
INTRAVENOUS | Status: DC | PRN
  Administered 2023-01-30: 200 mg via INTRAVENOUS

## 2023-01-30 MED ORDER — POTASSIUM CHLORIDE 2 MEQ/ML IV SOLN
INTRAVENOUS | Status: DC
  Filled 2023-01-30: qty 1000

## 2023-01-30 MED ORDER — NAPHAZOLINE-GLYCERIN 0.012-0.25 % OP SOLN
1.0000 [drp] | Freq: Four times a day (QID) | OPHTHALMIC | Status: DC | PRN
Start: 2023-01-30 — End: 2023-02-03
  Administered 2023-02-01: 2 [drp] via OPHTHALMIC
  Filled 2023-01-30: qty 15

## 2023-01-30 MED ORDER — ALVIMOPAN 12 MG PO CAPS
12.0000 mg | ORAL_CAPSULE | Freq: Two times a day (BID) | ORAL | Status: DC
  Administered 2023-01-31 (×2): 12 mg via ORAL
  Filled 2023-01-30 (×3): qty 1

## 2023-01-30 MED ORDER — METOPROLOL TARTRATE 5 MG/5ML IV SOLN: 5.0000 mg | Freq: Four times a day (QID) | INTRAVENOUS | Status: DC | PRN

## 2023-01-30 MED ORDER — 0.9 % SODIUM CHLORIDE (POUR BTL) OPTIME
TOPICAL | Status: DC | PRN
  Administered 2023-01-30: 1000 mL

## 2023-01-30 MED ORDER — SODIUM CHLORIDE 0.9 % IV SOLN
2.0000 g | INTRAVENOUS | Status: AC
  Administered 2023-01-30: 2 g via INTRAVENOUS
  Filled 2023-01-30: qty 2

## 2023-01-30 MED ORDER — HYDROMORPHONE HCL 1 MG/ML IJ SOLN: 0.2500 mg | INTRAMUSCULAR | Status: DC | PRN

## 2023-01-30 MED ORDER — CALCIUM POLYCARBOPHIL 625 MG PO TABS
625.0000 mg | ORAL_TABLET | Freq: Two times a day (BID) | ORAL | Status: DC
  Administered 2023-01-30 – 2023-02-03 (×8): 625 mg via ORAL
  Filled 2023-01-30 (×8): qty 1

## 2023-01-30 MED ORDER — ENSURE PRE-SURGERY PO LIQD: 592.0000 mL | Freq: Once | ORAL | Status: DC

## 2023-01-30 MED ORDER — SODIUM CHLORIDE 0.9% FLUSH
3.0000 mL | Freq: Two times a day (BID) | INTRAVENOUS | Status: DC
  Administered 2023-01-31 – 2023-02-02 (×5): 3 mL via INTRAVENOUS

## 2023-01-30 MED ORDER — EPHEDRINE SULFATE (PRESSORS) 50 MG/ML IJ SOLN
INTRAMUSCULAR | Status: DC | PRN
  Administered 2023-01-30: 5 mg via INTRAVENOUS
  Administered 2023-01-30 (×3): 10 mg via INTRAVENOUS
  Administered 2023-01-30: 5 mg via INTRAVENOUS

## 2023-01-30 MED ORDER — ENOXAPARIN SODIUM 40 MG/0.4ML IJ SOSY
40.0000 mg | PREFILLED_SYRINGE | Freq: Once | INTRAMUSCULAR | Status: AC
Start: 2023-01-30 — End: 2023-01-30
  Administered 2023-01-30: 40 mg via SUBCUTANEOUS
  Filled 2023-01-30: qty 0.4

## 2023-01-30 MED ORDER — POLYETHYLENE GLYCOL 3350 17 GM/SCOOP PO POWD: 1.0000 | Freq: Once | ORAL | Status: DC

## 2023-01-30 MED ORDER — B COMPLEX-C PO TABS
1.0000 | ORAL_TABLET | Freq: Every day | ORAL | Status: DC
Start: 2023-01-31 — End: 2023-02-03
  Administered 2023-01-31 – 2023-02-03 (×4): 1 via ORAL
  Filled 2023-01-30 (×4): qty 1

## 2023-01-30 MED ORDER — CHLORHEXIDINE GLUCONATE 0.12 % MT SOLN
15.0000 mL | Freq: Once | OROMUCOSAL | Status: AC
  Administered 2023-01-30: 15 mL via OROMUCOSAL

## 2023-01-30 MED ORDER — ONDANSETRON HCL 4 MG/2ML IJ SOLN: 4.0000 mg | Freq: Once | INTRAMUSCULAR | Status: DC | PRN

## 2023-01-30 SURGICAL SUPPLY — 109 items
ADAPTER GOLDBERG URETERAL (ADAPTER) IMPLANT
ADPR CATH 15X14FR FL DRN BG (ADAPTER)
APL PRP STRL LF DISP 70% ISPRP (MISCELLANEOUS)
APPLIER CLIP 5 13 M/L LIGAMAX5 (MISCELLANEOUS)
APPLIER CLIP ROT 10 11.4 M/L (STAPLE)
APR CLP MED LRG 11.4X10 (STAPLE)
APR CLP MED LRG 5 ANG JAW (MISCELLANEOUS)
BAG COUNTER SPONGE SURGICOUNT (BAG) ×2 IMPLANT
BAG SPNG CNTER NS LX DISP (BAG) ×1
BAG URO CATCHER STRL LF (MISCELLANEOUS) ×2 IMPLANT
BLADE EXTENDED COATED 6.5IN (ELECTRODE) IMPLANT
CANNULA REDUCER 12-8 DVNC XI (CANNULA) IMPLANT
CATH URETL OPEN END 6FR 70 (CATHETERS) IMPLANT
CHLORAPREP W/TINT 26 (MISCELLANEOUS) IMPLANT
CLIP APPLIE 5 13 M/L LIGAMAX5 (MISCELLANEOUS) IMPLANT
CLIP APPLIE ROT 10 11.4 M/L (STAPLE) IMPLANT
CLOTH BEACON ORANGE TIMEOUT ST (SAFETY) ×2 IMPLANT
COVER SURGICAL LIGHT HANDLE (MISCELLANEOUS) ×4 IMPLANT
COVER TIP SHEARS 8 DVNC (MISCELLANEOUS) ×2 IMPLANT
DEVICE TROCAR PUNCTURE CLOSURE (ENDOMECHANICALS) IMPLANT
DRAIN CHANNEL 19F RND (DRAIN) IMPLANT
DRAPE ARM DVNC X/XI (DISPOSABLE) ×8 IMPLANT
DRAPE COLUMN DVNC XI (DISPOSABLE) ×2 IMPLANT
DRAPE SURG IRRIG POUCH 19X23 (DRAPES) ×2 IMPLANT
DRIVER NDL LRG 8 DVNC XI (INSTRUMENTS) ×2 IMPLANT
DRIVER NDLE LRG 8 DVNC XI (INSTRUMENTS) ×1
DRSG OPSITE POSTOP 4X10 (GAUZE/BANDAGES/DRESSINGS) IMPLANT
DRSG OPSITE POSTOP 4X6 (GAUZE/BANDAGES/DRESSINGS) IMPLANT
DRSG OPSITE POSTOP 4X8 (GAUZE/BANDAGES/DRESSINGS) IMPLANT
DRSG TEGADERM 2-3/8X2-3/4 SM (GAUZE/BANDAGES/DRESSINGS) ×10 IMPLANT
DRSG TEGADERM 4X4.75 (GAUZE/BANDAGES/DRESSINGS) IMPLANT
ELECT PENCIL ROCKER SW 15FT (MISCELLANEOUS) ×2 IMPLANT
ELECT REM PT RETURN 15FT ADLT (MISCELLANEOUS) ×2 IMPLANT
ENDOLOOP SUT PDS II 0 18 (SUTURE) IMPLANT
EVACUATOR SILICONE 100CC (DRAIN) IMPLANT
GAUZE SPONGE 2X2 8PLY STRL LF (GAUZE/BANDAGES/DRESSINGS) ×2 IMPLANT
GLOVE ECLIPSE 8.0 STRL XLNG CF (GLOVE) ×6 IMPLANT
GLOVE INDICATOR 8.0 STRL GRN (GLOVE) ×6 IMPLANT
GLOVE SURG LX STRL 7.5 STRW (GLOVE) ×2 IMPLANT
GOWN SRG XL LVL 4 BRTHBL STRL (GOWNS) ×2 IMPLANT
GOWN STRL NON-REIN XL LVL4 (GOWNS) ×1
GOWN STRL REUS W/ TWL XL LVL3 (GOWN DISPOSABLE) ×8 IMPLANT
GOWN STRL REUS W/TWL XL LVL3 (GOWN DISPOSABLE) ×4
GRASPER SUT TROCAR 14GX15 (MISCELLANEOUS) IMPLANT
GRASPER TIP-UP FEN DVNC XI (INSTRUMENTS) ×2 IMPLANT
GUIDEWIRE ANG ZIPWIRE 038X150 (WIRE) IMPLANT
GUIDEWIRE STR DUAL SENSOR (WIRE) IMPLANT
HOLDER FOLEY CATH W/STRAP (MISCELLANEOUS) ×2 IMPLANT
IRRIG SUCT STRYKERFLOW 2 WTIP (MISCELLANEOUS) ×1
IRRIGATION SUCT STRKRFLW 2 WTP (MISCELLANEOUS) ×2 IMPLANT
KIT PROCEDURE DVNC SI (MISCELLANEOUS) ×2 IMPLANT
KIT SIGMOIDOSCOPE (SET/KITS/TRAYS/PACK) IMPLANT
KIT TURNOVER KIT A (KITS) IMPLANT
MANIFOLD NEPTUNE II (INSTRUMENTS) ×2 IMPLANT
NDL INSUFFLATION 14GA 120MM (NEEDLE) ×2 IMPLANT
NEEDLE INSUFFLATION 14GA 120MM (NEEDLE) ×1
PACK CARDIOVASCULAR III (CUSTOM PROCEDURE TRAY) ×2 IMPLANT
PACK COLON (CUSTOM PROCEDURE TRAY) ×2 IMPLANT
PACK CYSTO (CUSTOM PROCEDURE TRAY) ×2 IMPLANT
PAD POSITIONING PINK XL (MISCELLANEOUS) ×2 IMPLANT
PAD PREP 24X48 CUFFED NSTRL (MISCELLANEOUS) ×2 IMPLANT
PROTECTOR NERVE ULNAR (MISCELLANEOUS) ×4 IMPLANT
RELOAD STAPLE 45 3.5 BLU DVNC (STAPLE) IMPLANT
RELOAD STAPLE 45 4.3 GRN DVNC (STAPLE) IMPLANT
RELOAD STAPLE 60 3.5 BLU DVNC (STAPLE) IMPLANT
RELOAD STAPLE 60 4.3 GRN DVNC (STAPLE) IMPLANT
RETRACTOR WND ALEXIS 18 MED (MISCELLANEOUS) IMPLANT
RTRCTR WOUND ALEXIS 18CM MED (MISCELLANEOUS)
SCISSORS LAP 5X35 DISP (ENDOMECHANICALS) ×2 IMPLANT
SCISSORS MNPLR CVD DVNC XI (INSTRUMENTS) ×2 IMPLANT
SEAL UNIV 5-12 XI (MISCELLANEOUS) ×8 IMPLANT
SEALER VESSEL EXT DVNC XI (MISCELLANEOUS) ×2 IMPLANT
SOL ELECTROSURG ANTI STICK (MISCELLANEOUS) ×1
SOLUTION ELECTROSURG ANTI STCK (MISCELLANEOUS) ×2 IMPLANT
SPIKE FLUID TRANSFER (MISCELLANEOUS) ×2 IMPLANT
STAPLER 45 SUREFORM DVNC (STAPLE) IMPLANT
STAPLER 60 SUREFORM DVNC (STAPLE) IMPLANT
STAPLER ECHELON POWER CIR 29 (STAPLE) IMPLANT
STAPLER ECHELON POWER CIR 31 (STAPLE) IMPLANT
STAPLER RELOAD 3.5X45 BLU DVNC (STAPLE)
STAPLER RELOAD 3.5X60 BLU DVNC (STAPLE) ×3
STAPLER RELOAD 4.3X45 GRN DVNC (STAPLE)
STAPLER RELOAD 4.3X60 GRN DVNC (STAPLE)
STOPCOCK 4 WAY LG BORE MALE ST (IV SETS) ×4 IMPLANT
SURGILUBE 2OZ TUBE FLIPTOP (MISCELLANEOUS) IMPLANT
SUT MNCRL AB 4-0 PS2 18 (SUTURE) ×2 IMPLANT
SUT PDS AB 1 CT1 27 (SUTURE) ×4 IMPLANT
SUT PROLENE 0 CT 2 (SUTURE) IMPLANT
SUT PROLENE 2 0 KS (SUTURE) IMPLANT
SUT PROLENE 2 0 SH DA (SUTURE) IMPLANT
SUT SILK 2 0 SH CR/8 (SUTURE) IMPLANT
SUT SILK 3 0 SH CR/8 (SUTURE) ×2 IMPLANT
SUT V-LOC BARB 180 2/0GR6 GS22 (SUTURE)
SUT VIC AB 3-0 SH 18 (SUTURE) IMPLANT
SUT VIC AB 3-0 SH 27 (SUTURE)
SUT VIC AB 3-0 SH 27XBRD (SUTURE) IMPLANT
SUT VICRYL 0 UR6 27IN ABS (SUTURE) IMPLANT
SUTURE V-LC BRB 180 2/0GR6GS22 (SUTURE) IMPLANT
SYR 20ML ECCENTRIC (SYRINGE) ×2 IMPLANT
SYS LAPSCP GELPORT 120MM (MISCELLANEOUS)
SYS WOUND ALEXIS 18CM MED (MISCELLANEOUS) ×1
SYSTEM LAPSCP GELPORT 120MM (MISCELLANEOUS) IMPLANT
SYSTEM WOUND ALEXIS 18CM MED (MISCELLANEOUS) ×2 IMPLANT
TOWEL OR NON WOVEN STRL DISP B (DISPOSABLE) ×2 IMPLANT
TRAY FOLEY MTR SLVR 16FR STAT (SET/KITS/TRAYS/PACK) ×2 IMPLANT
TROCAR ADV FIXATION 5X100MM (TROCAR) ×2 IMPLANT
TUBING CONNECTING 10 (TUBING) ×6 IMPLANT
TUBING INSUFFLATION 10FT LAP (TUBING) ×2 IMPLANT
TUBING UROLOGY SET (TUBING) IMPLANT

## 2023-01-30 NOTE — Op Note (Signed)
Preoperative diagnosis: Crohn's disease, small bowel fistula and abscess, history of bladder cancer Postoperative diagnosis: Same  Procedure: Cystoscopy with retrograde injection of firefly bilaterally  Surgeon: Mena Goes  Anesthesia: General  Indication for procedure: Zachary Oneal is a 72 year old male with a history of Crohn's disease small bowel fistula and abscess undergoing bowel resection today.  Urology requested to identify the ureters with firefly fluorescence.  Findings: On cystoscopy the urethra and the bladder were unremarkable.  I did use a irrigation less technique to preserve irrigation fluids and therefore this was not a diagnostic cystoscopy.  Description of procedure: After consent was obtained patient brought to the operating room.  After adequate anesthesia he is placed lithotomy position and prepped and draped in the usual sterile fashion.  Timeout was performed to confirm the patient and procedure.  The cystoscope was passed per urethra where the trigone was readily visualized.  I used a angled Glidewire to help cannulate the right ureteral orifice and then retrograde injection of 7.5 cc of firefly was performed.  This was allowed to dwell for about 30 seconds and then the catheter removed.  Similarly of the left ureteral orifice was cannulated and left retrograde injection of firefly was performed.  Injection went easily and without resistance on both sides.  The scope was backed out and a 16 [place] Foley catheter was placed and left to gravity drainage.  Patient was turned over to Dr. Michaell Cowing for his procedure.  Complications: None  Blood loss: Minimal  Specimens: None  Drains: 16 French Foley catheter  Disposition: Patient intubated in OR and turned over to Dr. Michaell Cowing.

## 2023-01-30 NOTE — Transfer of Care (Signed)
Immediate Anesthesia Transfer of Care Note  Patient: Zachary Oneal  Procedure(s) Performed: ROBOTIC ILEOCOLECTOMY PROXIMAL CYSTOSCOPY with FIREFLY INJECTION  Patient Location: PACU  Anesthesia Type:General  Level of Consciousness: drowsy  Airway & Oxygen Therapy: Patient Spontanous Breathing and Patient connected to nasal cannula oxygen  Post-op Assessment: Report given to RN and Post -op Vital signs reviewed and stable  Post vital signs: Reviewed and stable  Last Vitals:  Vitals Value Taken Time  BP 118/41 01/30/23 1445  Temp    Pulse 55 01/30/23 1446  Resp 20 01/30/23 1446  SpO2 96 % 01/30/23 1446  Vitals shown include unfiled device data.  Last Pain:  Vitals:   01/30/23 0915  TempSrc: Oral  PainSc: 0-No pain         Complications: No notable events documented.

## 2023-01-30 NOTE — Anesthesia Procedure Notes (Signed)
Procedure Name: Intubation Date/Time: 01/30/2023 11:55 AM  Performed by: Nathen May, CRNAPre-anesthesia Checklist: Patient identified, Emergency Drugs available, Suction available and Patient being monitored Patient Re-evaluated:Patient Re-evaluated prior to induction Oxygen Delivery Method: Circle System Utilized Preoxygenation: Pre-oxygenation with 100% oxygen Induction Type: IV induction Ventilation: Mask ventilation without difficulty Laryngoscope Size: Mac and 3 Tube type: Oral Tube size: 7.5 mm Number of attempts: 1 Airway Equipment and Method: Stylet and Oral airway Placement Confirmation: ETT inserted through vocal cords under direct vision, positive ETCO2 and breath sounds checked- equal and bilateral Tube secured with: Tape Dental Injury: Teeth and Oropharynx as per pre-operative assessment

## 2023-01-30 NOTE — Op Note (Addendum)
01/30/2023  2:39 PM  PATIENT:  Zachary Oneal  72 y.o. 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  This assistant was needed for exposure, dissection, suction, tissue approximation, retraction, perception, etc  ANESTHESIA:  General endotracheal intubation anesthesia (GETA) and Regional TRANSVERSUS ABDOMINIS PLANE (TAP) nerve block -BILATERAL for perioperative & postoperative pain control at the level of the transverse abdominis & preperitoneal spaces along the flank at the anterior axillary line, from subcostal ridge to iliac crest under laparoscopic guidance provided with liposomal bupivacaine (Experel) 20mL mixed with 50 mL of bupivicaine 0.25% with epinephrine  Estimated Blood Loss (EBL):   Total I/O In: 600 [I.V.:600] Out: 350 [Urine:200; Blood:150].   (See anesthesia record)  Delay start of Pharmacological VTE agent (>24hrs) due to concerns of significant anemia, surgical blood loss, or risk of  bleeding?:  no  DRAINS: (None)  SPECIMEN: Distal ileum, appendix, cecum, ascending colon.  DISPOSITION OF SPECIMEN:  Pathology  COUNTS:  Sponge, needle, & instrument counts CORRECT  PLAN OF CARE: Admit to inpatient   PATIENT DISPOSITION:  PACU - hemodynamically stable.  INDICATION:    72 year old male with history of Crohn's disease with history of repeated flares and prior abscesses and possible fistulas and prolonged stricture in distal ileum despite close follow-up with Gastroenterology and biologic immunosuppression.  Some improvement on the abscess but still with long fibrotic stricture.  Discussed between surgery Gastroenterology.  Plan for resection of chronic stricture and transition to different biologic medicine from [facility] [place] and most likely to [place] under the care of [facility] and [facility].  I recommended segmental resection:  The anatomy & physiology of the digestive tract was discussed.  The pathophysiology was discussed.  Natural history risks without surgery was discussed.   I worked to give an overview of the disease and the frequent need to have multispecialty involvement.  I feel the risks of no intervention will lead to serious problems that outweigh the operative risks; therefore, I recommended a partial colectomy to remove the pathology.  Laparoscopic & open techniques were discussed.   Risks such as bleeding, infection, abscess, leak, reoperation, possible ostomy, hernia, heart attack, death, and other risks were discussed.  I noted a good likelihood this will help address the problem.   Goals of post-operative recovery were discussed as well.  We will work to minimize complications.  An educational handout on the pathology was given as well.  Questions were answered.    The patient expresses understanding & wishes to proceed with surgery.  OR FINDINGS:   Patient had thickened twisted ileocecal region consistent with chronic Crohn disease with phlegmon.   Appendix obliterated.  Distal ileal stricture 15 cm from ileocecal  region as well.  Dilated ileum proximal to this with much less disease.  Colon appeared to be spared.  Sigmoid colon with mesentery adherent to obliterated appendix but no active fistula to the colon.  Rectosigmoid secondarily inflamed & did not seem to have any active Crohn's colitis or proctitis.  Therefore, no rectosigmoid resection done.  It is an isoperistaltic ileocolonic (distal ileum to mid ascending colon) anastomosis that rests in the right upper quadrant hepatic flexure region.  CASE DATA: Type of patient?: Elective [facility] Private Case Status of Case? Elective Scheduled Infection Present At Time Of Surgery (PATOS)?  PHLEGMON  DESCRIPTION:   Informed consent was confirmed.  The patient underwent general anaesthesia without difficulty.  The patient was positioned with arms tucked & secured appropriately.  VTE prevention in place.  Given the history of Crohn's disease with fistula and abscesses I asked urology for cystoscopy with firefly ureteral insufflation.  Dr. Mena Goes with [facility] did that.  He noted no obvious major cystitis nor difficulty with UO ureteral intubation.  Please see separate note.  The patient's abdomen was clipped, prepped, & draped in a sterile fashion.  Surgical timeout confirmed our plan.  The patient was positioned in reverse Trendelenburg.  Abdominal entry was gained using Varess technique at the left subcostal ridge on the anterior abdominal wall.  No elevated EtCO2 noted.  Port placed.  Camera inspection revealed no injury.  Significant phlegmon noted in right lower quadrant at ileocecal region consistent with chronic Crohn's disease.  Extra ports were carefully placed under direct laparoscopic visualization.  We docked the [employer] Vinci robot carefully and placed intstruments under visualization  We assisted the patient somewhat headdown given the right lower quadrant to pelvis phlegmon.   We worked to elevate the ileal mesentery off the retroperitoneum encountered a dense fibrotic chronic abscess cavity pocket.  No major purulence.  The appendix appeared to be obliterated.  The mesentery of the distal sigmoid colon was adherent to this region.  We carefully freed off the inflamed terminal ileum mesentery off the retroperitoneum and the sigmoid colon.  Did careful dissection.  I also did some lateral medial mobilization from the hepatic flexure along the right paracolic gutter coming towards the ileocecal region.  Mobilized the ascending colon mesentery off the retroperitoneum.  Found more proximal ileal mesentery that was not inflamed and freed it off the retroperitoneum in the medial lateral fashion.  Elevated the Ileocolonic Mesentery off the Retroperitoneum to Find the Duodenal Sweep.  Kept that in the retroperitoneal position.  Was able to identify the right ureter.  With mid ileum and ascending colon elevated & identified, now fully elevate the significantly inflamed fibrotic ileocecal and appendiceal mesentery and tissue off the retroperitoneum.  Ridiculously freed off this inflamed mass off the retroperitoneum staying superficial to the right gonadal vessels and right ureter, sparing those structures.  With that I was able to finally fully mobilized the ileocolonic region.  We inspected the ileum.  Noted a fibrotic stricture about 15 cm proximal to the ileocecal valve which correlated with the prior MR enterography of a separate ileal stricture.  Do not see any obvious ileal or small bowel disease or active Crohn's disease proximal to this.  Transected through the distal ileal mesentery coming towards the ileocecal region.  Mesentery was extremely inflamed and phlegmonous and oozy.  I did have and come through with the vessel sealer and many layers.  In order to spare as much bowel as possible in this patient with Crohn's  disease, we chose a region in the mid ascending colon that was not  inflamed away from her thing and found a window in the mesentery there and headed towards the ileocecal mesentery pedicle as well.  Having established proximal distal margins I elevated the massive phlegmon and we meticulously came through the ileal cecal mesentery layer upon layer, taking care to spare right colon and ileal mesenteric supply.  Eventually able to get through it and elevate it.  We assured hemostasis.  To assess vascular perfusion of tissues, we asked anesthesia use intravenous  indocyanine green (ICG) with IV flush.  I switched to the NIR fluorescence (Firefly mode) imaging window on the daVinci robot platform.  We were able to see good light green visualization of blood vessels with good vascular perfusion of tissues, confirming good tissue perfusion of tissues (distal ileum and mid ascending colon) planned for anastomosis.     We went ahead and proceeded with transection.  We transected at the distal ileum with a robotic stapler 60mm blue load.  We then transected at the mid ascending colon with a robotic stapler 60mm blue load.  We confirmed hemostasis.  I did a side-to-side stapled anastomosis of distal ileum to mid ascending colon using a 60mm blue load in an isoperistaltic fashion.  (Distal stump of ileum to mid transverse colon for the distal end of the anastomosis.  Proximal end of colon stump to more proximal ileum for the proximal end of the anastomosis).  I sewed the common staple channel wound with an absorbable suture (V-lock) in a running [place] fashion from each corner and meeting in the center.  We did meticulous inspection prove an airtight closure.  I protected the anastomosis line with epiploic appendages of the colon to cover up the ileal staple line and the common channel Connell suture closure using V lock suture.  Patient really did not have any greater omentum on the right side at all.  I ran the small bowel from the ileocolonic anastomosis more proximally to make sure  the mesentery laid flat on the retroperitoneum and there was no twisting or torsion.  We did reinspection of the abdomen.  Hemostasis was good.   Ureters, retroperitoneum, and bowel uninjured.  The anastomosis looked healthy.   Endoluminal gas was evacuated.  We placed the wound protector through the suprapubic 12mm port site after it was enlarged in a Pfannenstiel fashion.  Came through my old preperitoneal mesh from the prior laparoscopic tap repair.  Specimen removed without incident.   Ports & wound protector removed.  Hemostasis was good.  Sterile unused instruments were used from this point.  I closed the skin at the port sites using Monocryl stitch and sterile dressing.  I closed the extraction wound using a 0 Vicryl vertical peritoneal closure, incorporating the old inguinal hernia preperitoneal mesh,and a #1 PDS transverse anterior rectal fascial closure like a small Pfannenstiel closure. I closed the skin with some interrupted Monocryl stitches.  I placed sterile dressings.     Patient is being extubated go to recovery room. I discussed postop care with the patient in detail the office & in the holding area. Instructions are written. I discussed operative findings, updated the patient's status, discussed probable steps to recovery, and gave postoperative recommendations to the patient's spouse, Zachary Oneal .  Recommendations were made.  Questions were answered.  She expressed understanding & appreciation.  Ardeth Sportsman, M.D., F.A.C.S. Gastrointestinal and Minimally Invasive Surgery [facility] [address] [place], [place] [address] [phone] Main / Paging

## 2023-01-30 NOTE — H&P (Signed)
01/30/2023      REFERRING PHYSICIAN: Kathi Der, MD  Patient Care Team: Daisy Floro, MD as PCP - General (Family Medicine) Kathi Der, MD (Gastroenterology) Michaell Cowing, Shawn Route, MD as Consulting Provider (Colon and Rectal Surgery) Karl Pock as Physician Assistant (Gastroenterology) Josph Macho, MD (Hematology and Oncology) Crist Fat, MD (Urology) Donnetta Hail, MD (Internal Medicine)  PROVIDER: Jarrett Soho, MD  [facility] MRN: [id number] DOB: 03-04-1951  SUBJECTIVE   Chief Complaint: New Consultation   Zachary Oneal is a 72 y.o. male  who is seen today as an office consultation  at the request of DrLevora Angel  for evaluation of perianal dry with Crohn's disease.  History of Present Illness:  72 year old male with history of Crohn's disease. I had fixed an inguinal hernia or on him in 2018. Gets most of his IBD care with [facility] but I think occasionally reaches out to [facility] as well. Presented with diarrhea in 2017. Evidence of ileal disease. 15 cm segment. Started on Pentasa. Cut from twice daily to daily due to diarrhea. With persistent inflammation budesonide was added. His ileal inflammation seem to have tapered off. Patient also with evidence of urothelial cancer/growth followed by Dr. Marlou Porch with [facility]. [facility] started him in 2020 on biologic vedolizumab/Entyvio every 6 weeks now.  Worsening inflammation was placed on a steroid taper in 2023. He had some worsening abdominal complaints and had evidence of an intra-abdominal abscess in July. Recommended to go to the hospital. 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 Has been on antibiotics but not admitted. There is discussion about switching from Entyvio to Humira but I think that is on pause now. His diarrhea has been under control and he moves his bowels about twice a day. Abdominal pain is better controlled.   Concern for chronic abscess involving  terminal ileum with probable fistulization to cecum and may be even sigmoid colon. Patient going back to see [facility] again. Dr. Levora Angel concerned that patient is going to require bowel resection.   INFLAMMATORY BOWEL DISEASE HISTORY:  Type (Crohn's, UC, Indeterminate): Crohn's  Date of Diagnosis: 2017  Disease Extent  Crohn's (colonic, ileal, ileocolonic, perianal, other): Ileal  Phenotype (inflammatory, fistulizing, stricturing): Inflammatory  Surgeries: No small bowel or colon resections. No anorectal surgeries. Complications: None  Extra-intestinal Manifestations:  Last Hospitalization associated with IBD: None Last Flare:  TREATMENT HISTORY: Pentasa 1g BID currently (started in 2017 to present) 6-TG and 6-MMP (level/date): normal (08/2017)  Budesonide 9mg  daily (started 09/02/2017) Vedolizimab Thompson Grayer) -started 2020  ENDOSCOPIC HISTORY: Colonoscopy performed Dr. Evette Cristal at [facility] 10/27/2017. 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 Colon normal. Anorectal region normal.   Medical History:  Past Medical History:  Diagnosis Date  Crohn's disease involving terminal ileum (CMS/HHS-HCC)  Hyperlipidemia  Hypertension   Patient Active Problem List  Diagnosis  History of hypertension  Crohn's disease of ileum, with abscess (CMS/HHS-HCC)  Hyperlipidemia  Crohn's disease of ileum, with fistula (CMS/HHS-HCC)  Tobacco abuse  Immunocompromised state due to drug therapy (CMS/HHS-HCC)   Past Surgical History:  Procedure Laterality Date  INGUINAL HERNIA REPAIR    Allergies  Allergen Reactions  Hydrocodone-Acetaminophen Itching  Phenazopyridine Rash   Current Outpatient Medications on File Prior to Visit  Medication Sig Dispense Refill  amLODIPine (NORVASC) 5 MG tablet Take 10 mg by mouth  aspirin 325 MG tablet Take 325 mg by mouth once daily  b complex vitamins capsule Take 1 capsule by mouth once  daily  bisoprolol (ZEBETA) 5 MG tablet Take 5 mg by  mouth once daily 0  cholecalciferol (CHOLECALCIFEROL) 1000 unit tablet Take by mouth  citalopram (CELEXA) 20 MG tablet Take 20 mg by mouth once daily 3  hydrOXYzine HCL (ATARAX) 10 MG tablet 1 tablet  mometasone-formoterol (DULERA) 100-5 mcg/actuation inhaler Inhale 2 inhalations into the lungs 2 (two) times daily  rosuvastatin (CRESTOR) 20 MG tablet Take 20 mg by mouth once daily  traZODone (DESYREL) 50 MG tablet Take 50 mg by mouth at bedtime  benazepril-hydrochlorthiazide (LOTENSIN HCT) 20-25 mg tablet (Patient not taking: Reported on 12/10/2022)  budesonide (ENTOCORT EC) 3 mg EC capsule (Patient not taking: Reported on 12/10/2022)  fenofibrate micronized (LOFIBRA) 134 MG capsule (Patient not taking: Reported on 12/10/2022)  fluticasone propionate (FLONASE) 50 mcg/actuation nasal spray Place 2 sprays into both nostrils once daily (Patient not taking: Reported on 12/10/2022)  mesalamine (PENTASA) 500 mg CR capsule 1,000 mg 2 (two) times daily. (Patient not taking: Reported on 07/26/2020)  mirtazapine (REMERON) 15 MG tablet Take 15 mg by mouth nightly as needed (Patient not taking: Reported on 07/26/2020)  rifAXIMin (XIFAXAN) 550 mg tablet Take 1 tablet (550 mg total) by mouth 3 (three) times a day (Patient not taking: Reported on 04/22/2018) 66 tablet 0  rifAXIMin (XIFAXAN) 550 mg tablet Take 1 tablet (550 mg total) by mouth 3 (three) times a day (Patient not taking: Reported on 04/22/2018) 42 tablet 0  vedolizumab (ENTYVIO) 300 mg injection every 8 wks (Patient not taking: Reported on 12/10/2022)   No current facility-administered medications on file prior to visit.   Family History  Problem Relation Age of Onset  Cancer Mother  Pancreatic cancer Father  Inflammatory bowel disease Neg Hx    Social History   Tobacco Use  Smoking Status Every Day  Current packs/day: 1.00  Average packs/day: 1 pack/day for 30.0 years (30.0 ttl pk-yrs)  Types: Cigarettes  Smokeless Tobacco Not on file     Social History   Socioeconomic History  Marital status: Married  Tobacco Use  Smoking status: Every Day  Current packs/day: 1.00  Average packs/day: 1 pack/day for 30.0 years (30.0 ttl pk-yrs)  Types: Cigarettes  Substance and Sexual Activity  Alcohol use: Never  Drug use: Never   ############################################################  Review of Systems: A complete review of systems (ROS) was obtained from the patient.  We have reviewed this information and discussed as appropriate with the patient.  See HPI as well for other pertinent ROS.  Constitutional: No fevers, chills, sweats. Weight stable Eyes: No vision changes, No discharge HENT: No sore throats, nasal drainage Lymph: No neck swelling, No bruising easily Pulmonary: No cough, productive sputum CV: No orthopnea, PND . No exertional chest/neck/shoulder/arm pain. Patient can walk 2-3 miles without difficulty.   GI: No personal nor family history of GI/colon cancer, irritable bowel syndrome, allergy such as Celiac Sprue, dietary/dairy problems, colitis, ulcers nor gastritis. No recent sick contacts/gastroenteritis. No travel outside the country. No changes in diet.  Renal: No UTIs, No hematuria Genital: No drainage, bleeding, masses Musculoskeletal: No severe joint pain. Good ROM major joints Skin: No sores or lesions Heme/Lymph: No easy bleeding. No swollen lymph nodes Neuro: No active seizures. No facial droop Psych: No hallucinations. No agitation  OBJECTIVE   Vitals:  12/10/22 0940 12/10/22 0941  BP: 116/60  Temp: 37.1 C (98.8 F)  Weight: 87.2 kg (192 lb 3.2 oz)  Height: 185.4 cm (6\' 1" )  PainSc: 0-No pain  PainLoc: Rectum  Body mass index is 25.36 kg/m.  PHYSICAL EXAM:  Constitutional: Not cachectic. Hygeine adequate. Vitals signs as above.  Eyes: Wears glasses - vision corrected,Pupils reactive, normal extraocular movements. Sclera nonicteric Neuro: CN II-XII intact. No major focal  sensory defects. No major motor deficits. Lymph: No head/neck/groin lymphadenopathy Psych: No severe agitation. No severe anxiety. Judgment & insight Adequate, Oriented x4, HENT: Normocephalic, Mucus membranes moist. No thrush. Hearing: adequate Neck: Supple, No tracheal deviation. No obvious thyromegaly Chest: No pain to chest wall compression. Good respiratory excursion. No audible wheezing CV: Pulses intact. regular. No major extremity edema Ext: No obvious deformity or contracture. Edema: Not present. No cyanosis Skin: No major subcutaneous nodules. Warm and dry Musculoskeletal: Severe joint rigidity not present. No obvious clubbing. No digital petechiae. Mobility: no assist device moving easily without restrictions Abdomen: Flat Soft. Nondistended. Nontender. Hernia: Not present. Diastasis recti: Not present. No hepatomegaly. No splenomegaly. Genital/Pelvic: Inguinal hernia: Not present. Concern on CAT scan but I think it is just some spermatic cord fat no recurrent hernia. Asymptomatic. Inguinal lymph nodes: without lymphadenopathy nor hidradenitis.   Rectal: Perianal skin Mild fecal soiling  Pruritis ani: Mild Pilonidal disease: Not present Condyloma / warts: Not present  Anal fissure: Not present Perirectal abscess/fistula Not present External hemorrhoids mildly irritated small external hemorrhoids  Digital and anoscopic rectal exam Barely tolerated  Sphincter tone Normal  Mild narrowing irritation of anal canal but no active fissure or stricture. Hemorrhoidal piles 1-2. Prostate: Normal size & no nodularity Rectovaginal septum: N/A Rectal masses: Not present  Other significant findings: N/A  Patient examined with patient in decubitus position .  ###################################   ###################################################################  Labs, Imaging and Diagnostic Testing:  Located in 'Care Everywhere' section of Epic EMR chart  [facility]  NOTES:  Located in 'Care Everywhere' section of Epic EMR chart  SURGERY NOTES:  Located in 'Care Everywhere' section of Epic EMR chart  PATHOLOGY:  Located in 'Care Everywhere' section of Epic EMR chart  Assessment and Plan:  DIAGNOSES:  Diagnoses and all orders for this visit:  Crohn's disease of ileum, with abscess (CMS/HHS-HCC)  Crohn's disease of ileum, with fistula (CMS/HHS-HCC)  Tobacco abuse  Immunocompromised state due to drug therapy (CMS/HHS-HCC)  Other orders - polyethylene glycol (MIRALAX) powder; Take 233.75 g by mouth once for 1 dose Take according to your procedure prep instructions. - bisacodyL (DULCOLAX) 5 mg EC tablet; Take 4 tablets (20 mg total) by mouth once daily as needed for Constipation for up to 1 dose - metroNIDAZOLE (FLAGYL) 500 MG tablet; Take 2 tablets (1,000 mg total) by mouth 3 (three) times daily for 3 doses SEE BOWEL PREP INSTRUCTIONS: Take 2 tablets at 2pm, 3pm, and 10pm the day prior to your colon operation. - neomycin 500 mg tablet; Take 2 tablets (1,000 mg total) by mouth 3 (three) times daily for 3 doses SEE BOWEL PREP INSTRUCTIONS: Take 2 tablets at 2pm, 3pm, and 10pm the day prior to your colon operation.    ASSESSMENT/PLAN  Pleasant gentleman with Crohn's disease followed by [facility] and occasionally [facility] with chronic ileal inflammation abscess and possible fistula a despite immunosuppression with Entyvio biologic for the past 4 years.  I think he is at a point of futility where this is not a control. Chronic abscesses are hard to resolve since he needs antibiotics and he is immunosuppressed but often aspiration makes it worse. Discussed with my colorectal partner, Dr. Maisie Fus, who agrees. We recommend segmental resection of terminal ileum and  colon. Make sure there is no fistula involvement with the sigmoid colon or other areas. He does not seem toxic or sickly and he is not malnourished, so hopefully we can do an immediate  intracorporeal anastomosis. Will ask urology to see if they can do cystoscopy and firefly to make sure that the right ureter is not involved given the chronic abscess and inflammation. See and protect that.  The anatomy & physiology of the digestive tract was discussed. The pathophysiology of the colon was discussed. Natural history risks without surgery was discussed. I feel the risks of no intervention will lead to serious problems that outweigh the operative risks; therefore, I recommended a partial colectomy to remove the pathology. Minimally invasive (Robotic/Laparoscopic) & open techniques were discussed.   Risks such as bleeding, infection, abscess, leak, reoperation, injury to other organs, need for repair of tissues / organs, possible ostomy, hernia, heart attack, stroke, death, and other risks were discussed. I noted a good likelihood this will help address the problem. Goals of post-operative recovery were discussed as well. Need for adequate nutrition, daily bowel regimen and healthy physical activity, to optimize recovery was noted as well. We will work to minimize complications. Educational materials were available as well. Questions were answered. The patient expresses understanding & wishes to proceed with surgery.  I did caution with his smoking his risks of needing a ileostomy and leaks and other problems as increased. STOP SMOKING! We talked to the patient about the dangers of smoking. We stressed that tobacco use dramatically increases the risk of peri-operative complications such as infection, tissue necrosis leaving to problems with incision/wound and organ healing, hernia, chronic pain, heart attack, stroke, DVT, pulmonary embolism, and death. We noted there are programs in our community to help stop smoking. Information was available.   We will try and time this so that he is at the nadir of his Entyvio. He is about to get a dose so most likely will do in late October/November at the  6-week point just before he gets that. Hopefully that lowers the risk of needing ostomy or other issues.  According to Dr. Levora Angel with [facility] through the help of [facility], discussion about switching over to a different biologic.  I think the plan given the very long strictures proceed with resection and then adjust to different immunosuppressive regimen  There was concern about I recurrent left inguinal hernia on CAT scan. He has a little fat in the inguinal canal but I think is more cord lipoma than recurrence and there is no evidence of any recurrence on exam today and he is asymptomatic. He seems reassured.   Ardeth Sportsman, MD, FACS, MASCRS Esophageal, Gastrointestinal & Colorectal Surgery Robotic and Minimally Invasive Surgery  [facility] [address] [place], [place] [address] [phone] Fax [phone] Main  CONTACT INFORMATION: Weekday (9AM-5PM): Call [facility] main office at [phone] Weeknight (5PM-9AM) or Weekend/Holiday: Check EPIC "Web Links" tab & use "AMION" (password " [email or web address]") for General Surgery [facility] coverage  Please, DO NOT use SecureChat  (it is not reliable communication to reach operating surgeons & will lead to a delay in care).   Epic staff messaging available for outptient concerns needing 1-2 business day response.

## 2023-01-30 NOTE — Discharge Instructions (Addendum)
SURGERY: POST OP INSTRUCTIONS (Surgery for small bowel obstruction, colon resection, etc)   ######################################################################  EAT Gradually transition to a high fiber diet with a fiber supplement over the next few days after discharge  WALK Walk an hour a day.  Control your pain to do that.    CONTROL PAIN Control pain so that you can walk, sleep, tolerate sneezing/coughing, go up/down stairs.  HAVE A BOWEL MOVEMENT DAILY Keep your bowels regular to avoid problems.  OK to try a laxative to override constipation.  OK to use an antidairrheal to slow down diarrhea.  Call if not better after 2 tries  CALL IF YOU HAVE PROBLEMS/CONCERNS Call if you are still struggling despite following these instructions. Call if you have concerns not answered by these instructions  ######################################################################   DIET Follow a light diet the first few days at home.  Start with a bland diet such as soups, liquids, starchy foods, low fat foods, etc.  If you feel full, bloated, or constipated, stay on a ful liquid or pureed/blenderized diet for a few days until you feel better and no longer constipated. Be sure to drink plenty of fluids every day to avoid getting dehydrated (feeling dizzy, not urinating, etc.). Gradually add a fiber supplement to your diet over the next week.  Gradually get back to a regular solid diet.  Avoid fast food or heavy meals the first week as you are more likely to get nauseated. It is expected for your digestive tract to need a few months to get back to normal.  It is common for your bowel movements and stools to be irregular.  You will have occasional bloating and cramping that should eventually fade away.  Until you are eating solid food normally, off all pain medications, and back to regular activities; your bowels will not be normal. Focus on eating a low-fat, high fiber diet the rest of your life  (See Getting to Good Bowel Health, below).  CARE of your INCISION or WOUND  It is good for closed incisions and even open wounds to be washed every day.  Shower every day.  Short baths are fine.  Wash the incisions and wounds clean with soap & water.    You may leave closed incisions open to air if it is dry.   You may cover the incision with clean gauze & replace it after your daily shower for comfort.  TEGADERM:  You have clear gauze band-aid dressings over your closed incision(s).  Remove the dressings 3 days after surgery.    If you have an open wound with a wound vac, see wound vac care instructions.    ACTIVITIES as tolerated Start light daily activities --- self-care, walking, climbing stairs-- beginning the day after surgery.  Gradually increase activities as tolerated.  Control your pain to be active.  Stop when you are tired.  Ideally, walk several times a day, eventually an hour a day.   Most people are back to most day-to-day activities in a few weeks.  It takes 4-8 weeks to get back to unrestricted, intense activity. If you can walk 30 minutes without difficulty, it is safe to try more intense activity such as jogging, treadmill, bicycling, low-impact aerobics, swimming, etc. Save the most intensive and strenuous activity for last (Usually 4-8 weeks after surgery) such as sit-ups, heavy lifting, contact sports, etc.  Refrain from any intense heavy lifting or straining until you are off narcotics for pain control.  You will have off days, but   things should improve week-by-week. DO NOT PUSH THROUGH PAIN.  Let pain be your guide: If it hurts to do something, don't do it.  Pain is your body warning you to avoid that activity for another week until the pain goes down. You may drive when you are no longer taking narcotic prescription pain medication, you can comfortably wear a seatbelt, and you can safely make sudden turns/stops to protect yourself without hesitating due to pain. You may  have sexual intercourse when it is comfortable. If it hurts to do something, stop.   MEDICATIONS Take your usually prescribed home medications unless otherwise directed.   Blood thinners:  You can restart any strong blood thinners after the second postoperative day.  It is OK to continue aspirin before & after surgery..    Some blood in BMs the first 1-2 weeks is common but should taper down & be small volume.  If you are passing many large clots, call your surgeon    PAIN CONTROL Pain after surgery or related to activity is often due to strain/injury to muscle, tendon, nerves and/or incisions.  This pain is usually short-term and will improve in a few months.  To help speed the process of healing and to get back to regular activity more quickly, DO THE FOLLOWING THINGS TOGETHER: Increase activity gradually.  DO NOT PUSH THROUGH PAIN Use Ice and/or Heat Try Gentle Massage and/or Stretching Take over the counter pain medication Take Narcotic prescription pain medication for more severe pain  Good pain control = faster recovery.  It is better to take more medicine to be more active than to stay in bed all day to avoid medications.  Increase activity gradually Avoid heavy lifting at first, then increase to lifting as tolerated over the next 6 weeks. Do not "push through" the pain.  Listen to your body and avoid positions and maneuvers than reproduce the pain.  Wait a few days before trying something more intense Walking an hour a day is encouraged to help your body recover faster and more safely.  Start slowly and stop when getting sore.  If you can walk 30 minutes without stopping or pain, you can try more intense activity (running, jogging, aerobics, cycling, swimming, treadmill, sex, sports, weightlifting, etc.) Remember: If it hurts to do it, then don't do it! Use Ice and/or Heat You will have swelling and bruising around the incisions.  This will take several weeks to resolve. Ice packs  or heating pads (6-8 times a day, 30-60 minutes at a time) will help sooth soreness & bruising. Some people prefer to use ice alone, heat alone, or alternate between ice & heat.  Experiment and see what works best for you.  Consider trying ice for the first few days to help decrease swelling and bruising; then, switch to heat to help relax sore spots and speed recovery. Shower every day.  Short baths are fine.  It feels good!  Keep the incisions and wounds clean with soap & water.   Try Gentle Massage and/or Stretching Massage at the area of pain many times a day Stop if you feel pain - do not overdo it Take over the counter pain medication This helps the muscle and nerve tissues become less irritable and calm down faster Choose ONE of the following over-the-counter anti-inflammatory medications: Acetaminophen 500mg tabs (Tylenol) 1-2 pills with every meal and just before bedtime (avoid if you have liver problems or if you have acetaminophen in you narcotic prescription) Naproxen 220mg tabs (  ex. Aleve, Naprosyn) 1-2 pills twice a day (avoid if you have kidney, stomach, IBD, or bleeding problems) Ibuprofen 200mg tabs (ex. Advil, Motrin) 3-4 pills with every meal and just before bedtime (avoid if you have kidney, stomach, IBD, or bleeding problems) Take with food/snack several times a day as directed for at least 2 weeks to help keep pain / soreness down & more manageable. Take Narcotic prescription pain medication for more severe pain A prescription for strong pain control is often given to you upon discharge (for example: oxycodone/Percocet, hydrocodone/Norco/Vicodin, or tramadol/Ultram) Take your pain medication as prescribed. Be mindful that most narcotic prescriptions contain Tylenol (acetaminophen) as well - avoid taking too much Tylenol. If you are having problems/concerns with the prescription medicine (does not control pain, nausea, vomiting, rash, itching, etc.), please call us [phone] to see if we need to switch you to a different pain medicine that will work better for you and/or control your side effects better. If you need a refill on your pain medication, you must call the office before 4 pm and on weekdays only.  By federal law, prescriptions for narcotics cannot be called into a pharmacy.  They must be filled out on paper & picked up from our office by the patient or authorized caretaker.  Prescriptions cannot be filled after 4 pm nor on weekends.    WHEN TO CALL US [phone] Severe uncontrolled or worsening pain  Fever over 101 F (38.5 C) Concerns with the incision: Worsening pain, redness, rash/hives, swelling, bleeding, or drainage Reactions / problems with new medications (itching, rash, hives, nausea, etc.) Nausea and/or vomiting Difficulty urinating Difficulty breathing Worsening fatigue, dizziness, lightheadedness, blurred vision Other concerns If you are not getting better after two weeks or are noticing you are getting worse, contact our office [phone] for further advice.  We may need to adjust your medications, re-evaluate you in the office, send you to the emergency room, or see what other things we can do to help. The clinic staff is available to answer your questions during regular business hours (8:30am-5pm).  Please don't hesitate to call and ask to speak to one of our nurses for clinical concerns.    A surgeon from [facility] is always on call at the hospitals 24 hours/day If you have a medical emergency, go to the nearest emergency room or call 911.  FOLLOW UP in our office One the day of your discharge from the hospital (or the next business weekday), please call [facility] to set up or confirm an appointment to see your surgeon in the office for a follow-up appointment.  Usually it is 2-3 weeks after your surgery.   If you have skin staples at your incision(s), let the office know so we can set up a time  in the office for the nurse to remove them (usually around 10 days after surgery). Make sure that you call for appointments the day of discharge (or the next business weekday) from the hospital to ensure a convenient appointment time. IF YOU HAVE DISABILITY OR FAMILY LEAVE FORMS, BRING THEM TO THE OFFICE FOR PROCESSING.  DO NOT GIVE THEM TO YOUR DOCTOR.  [facility] [address], [address], [place], [place]  [address] ? [phone] - Main [phone] - Toll Free,  [phone] - Fax [email or web address]    GETTING TO GOOD BOWEL HEALTH. It is expected for your digestive tract to need a few months to get back to   normal.  It is common for your bowel movements and stools to be irregular.  You will have occasional bloating and cramping that should eventually fade away.  Until you are eating solid food normally, off all pain medications, and back to regular activities; your bowels will not be normal.   Avoiding constipation The goal: ONE SOFT BOWEL MOVEMENT A DAY!    Drink plenty of fluids.  Choose water first. TAKE A FIBER SUPPLEMENT EVERY DAY THE REST OF YOUR LIFE During your first week back home, gradually add back a fiber supplement every day Experiment which form you can tolerate.   There are many forms such as powders, tablets, wafers, gummies, etc Psyllium bran (Metamucil), methylcellulose (Citrucel), Miralax or Glycolax, Benefiber, Flax Seed.  Adjust the dose week-by-week (1/2 dose/day to 6 doses a day) until you are moving your bowels 1-2 times a day.  Cut back the dose or try a different fiber product if it is giving you problems such as diarrhea or bloating. Sometimes a laxative is needed to help jump-start bowels if constipated until the fiber supplement can help regulate your bowels.  If you are tolerating eating & you are farting, it is okay to try a gentle laxative such as double dose MiraLax, prune juice, or Milk of Magnesia.  Avoid using  laxatives too often. Stool softeners can sometimes help counteract the constipating effects of narcotic pain medicines.  It can also cause diarrhea, so avoid using for too long. If you are still constipated despite taking fiber daily, eating solids, and a few doses of laxatives, call our office. Controlling diarrhea Try drinking liquids and eating bland foods for a few days to avoid stressing your intestines further. Avoid dairy products (especially milk & ice cream) for a short time.  The intestines often can lose the ability to digest lactose when stressed. Avoid foods that cause gassiness or bloating.  Typical foods include beans and other legumes, cabbage, broccoli, and dairy foods.  Avoid greasy, spicy, fast foods.  Every person has some sensitivity to other foods, so listen to your body and avoid those foods that trigger problems for you. Probiotics (such as active yogurt, Align, etc) may help repopulate the intestines and colon with normal bacteria and calm down a sensitive digestive tract Adding a fiber supplement gradually can help thicken stools by absorbing excess fluid and retrain the intestines to act more normally.  Slowly increase the dose over a few weeks.  Too much fiber too soon can backfire and cause cramping & bloating. It is okay to try and slow down diarrhea with a few doses of antidiarrheal medicines.   Bismuth subsalicylate (ex. Kayopectate, Pepto Bismol) for a few doses can help control diarrhea.  Avoid if pregnant.   Loperamide (Imodium) can slow down diarrhea.  Start with one tablet (2mg) first.  Avoid if you are having fevers or severe pain.  ILEOSTOMY PATIENTS WILL HAVE CHRONIC DIARRHEA since their colon is not in use.    Drink plenty of liquids.  You will need to drink even more glasses of water/liquid a day to avoid getting dehydrated. Record output from your ileostomy.  Expect to empty the bag every 3-4 hours at first.  Most people with a permanent ileostomy empty their  bag 4-6 times at the least.   Use antidiarrheal medicine (especially Imodium) several times a day to avoid getting dehydrated.  Start with a dose at bedtime & breakfast.  Adjust up or down as needed.  Increase antidiarrheal medications as   directed to avoid emptying the bag more than 8 times a day (every 3 hours). Work with your wound ostomy nurse to learn care for your ostomy.  See ostomy care instructions. TROUBLESHOOTING IRREGULAR BOWELS 1) Start with a soft & bland diet. No spicy, greasy, or fried foods.  2) Avoid gluten/wheat or dairy products from diet to see if symptoms improve. 3) Miralax 17gm or flax seed mixed in 8oz. water or juice-daily. May use 2-4 times a day as needed. 4) Gas-X, Phazyme, etc. as needed for gas & bloating.  5) Prilosec (omeprazole) over-the-counter as needed 6)  Consider probiotics (Align, Activa, etc) to help calm the bowels down  Call your doctor if you are getting worse or not getting better.  Sometimes further testing (cultures, endoscopy, X-ray studies, CT scans, bloodwork, etc.) may be needed to help diagnose and treat the cause of the diarrhea. [facility] [address], [address], [place], [place]  [address] [phone] - Main.    [phone]  - Toll Free.   [phone] - Fax [email or web address]  

## 2023-01-30 NOTE — Interval H&P Note (Signed)
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  The various methods of treatment have been discussed with the patient and family. After consideration of risks, benefits and other options for treatment, the patient has consented to  Procedure(s) with comments: ROBOTIC ILEOCOLECTOMY PROXIMAL (N/A) - 210 TOTAL INCLUDING [facility] CYSTOSCOPY with FIREFLY INJECTION (N/A) as a surgical intervention.  The patient's history has been reviewed, patient examined, no change in status, stable for surgery.  I have reviewed the patient's chart and labs.  Questions were answered to the patient's satisfaction.    I have re-reviewed the the patient's records, history, medications, and allergies.  I have re-examined the patient.  I again discussed intraoperative plans and goals of post-operative recovery.  The patient agrees to proceed.  Zachary Oneal  May 18, 1950 [id number]  Patient Care Team: Daisy Floro, MD as PCP - General (Family Medicine) Karie Soda, MD as Consulting Physician (General Surgery) Crist Fat, MD as Attending Physician (Urology) Kathi Der, MD as Consulting Physician (Gastroenterology) McGreal, Durene Romans, MD as Referring Physician (Gastroenterology) Karl Pock, PA-C as Physician Assistant (Gastroenterology) Josph Macho, MD as Consulting Physician (Oncology) Donnetta Hail, MD as Consulting Physician (Rheumatology)  Patient Active Problem List   Diagnosis Date Noted   Research study patient: ACCLAIM-Lp(a)) clinical trial  10/14/2022 10/14/2022   COPD  II / active smoking 05/20/2017   Upper airway cough syndrome 05/20/2017   Cigarette smoker 03/20/2016   Bilateral inguinal hernia (BIH) 03/20/2016   Generalized anxiety disorder 03/20/2016   Essential (primary) hypertension 03/20/2016   Leukocytosis 05/25/2015     Past Medical History:  Diagnosis Date   Anxiety    Arthritis    COPD (chronic obstructive pulmonary disease) (HCC)    Coronary artery disease    GERD (gastroesophageal reflux disease)    Hypertension    Leukocytosis    Dr. Myna Hidalgo sees him yearly   Research study patient: ACCLAIM-Lp(a)) clinical trial  10/14/2022 10/14/2022   Patient has randomized into the [place] on the Reduction of MACE w/ Elevated Lp(a) in Established CVD or High Risk for CVD (J3L-MC-EZEF-ACCLAIM-Lp(a)) clinical trial  10/14/2022     Ureteral mass    Wears glasses     Past Surgical History:  Procedure Laterality Date   CYSTOSCOPY WITH RETROGRADE PYELOGRAM, URETEROSCOPY AND STENT PLACEMENT Bilateral 12/06/2015   Procedure: CYSTOSCOPY WITH BILATERAL RETROGRADE PYELOGRAM, LEFT URETERAL BALLOON DILITATION, LEFT URETEROSCOPY AND LEFT URETERAL BIOPSY AND LEFT URETERAL STENT PLACEMENT;  Surgeon: Crist Fat, MD;  Location: [facility];  Service: Urology;  Laterality: Bilateral;   FRACTURE SURGERY Right 1975   Right ankle   HERNIA REPAIR     Multiple inguinal and ventral repaired with mesh   TONSILLECTOMY     WISDOM TOOTH EXTRACTION      Social History   Socioeconomic History   Marital status: Married    Spouse name: Not on file   Number of children: Not on file   Years of education: Not on file   Highest education level: Not on file  Occupational History   Not on file  Tobacco Use   Smoking status: Former    Current packs/day: 0.00    Average packs/day: 1 pack/day for 20.0 years (20.0 ttl pk-yrs)    Types: Cigarettes    Quit date: 01/28/2023   Smokeless tobacco: Never  Vaping Use  Vaping status: Never Used  Substance and Sexual Activity   Alcohol use: Yes    Alcohol/week: 0.0 standard drinks of alcohol    Comment: socially   Drug use: Never   Sexual activity: Not Currently  Other Topics Concern   Not on file  Social History Narrative   Originally from [place], [place]   Social Determinants of Health   Financial Resource Strain: Not on [employer] Insecurity: Not on file  Transportation Needs: Not on file  Physical Activity: Not on file  Stress: Not on file  Social Connections: Not on file  Intimate Partner Violence: Not on file    Family History  Problem Relation Age of Onset   Cancer Mother        unknown type   Pancreatic cancer Father     Medications Prior to Admission  Medication Sig Dispense Refill Last Dose   amLODipine (NORVASC) 10 MG tablet Take 1 tablet (10 mg total) by mouth daily. 90 tablet 0 01/30/2023 at 0700   aspirin 81 MG chewable tablet Chew 81 mg by mouth daily.   01/23/2023   B Complex Vitamins (VITAMIN B COMPLEX) CAPS Take 1 capsule by mouth daily.   01/23/2023   bisoprolol (ZEBETA) 5 MG tablet TAKE 1 TABLET BY MOUTH EVERY DAY 90 tablet 0 01/30/2023 at 0700   Cholecalciferol (VITAMIN D3) 50 MCG (2000 UT) TABS Take 2,000 Units by mouth daily.   01/23/2023   citalopram (CELEXA) 20 MG tablet Take 20 mg by mouth daily.   01/30/2023 at 0700   fluticasone (FLONASE) 50 MCG/ACT nasal spray Place 2 sprays into both nostrils daily as needed for allergies.   01/30/2023   hydrOXYzine (ATARAX/VISTARIL) 10 MG tablet Take 10 mg by mouth daily as needed for anxiety.   01/29/2023 at 1100   losartan (COZAAR) 25 MG tablet Take 1 tablet (25 mg total) by mouth every evening. 90 tablet 0 01/29/2023   mometasone-formoterol (DULERA) 100-5 MCG/ACT AERO Inhale 2 puffs into the lungs 2 (two) times daily as needed for shortness of breath.   01/30/2023 at 0700   Multiple Vitamins-Minerals (MULTIVITAMIN ADULTS 50+ PO) Take 2 tablets by mouth daily.   01/23/2023   rosuvastatin (CRESTOR) 20 MG tablet Take 20 mg by mouth daily.   01/30/2023 at 0700   traZODone (DESYREL) 50 MG tablet Take 25-100 mg by mouth at bedtime as needed for sleep.   Past Month    Current Facility-Administered Medications  Medication Dose Route Frequency Provider Last Rate Last  Admin   bupivacaine liposome (EXPAREL) 1.3 % injection 266 mg  20 mL Infiltration Once Karie Soda, MD       cefoTEtan (CEFOTAN) 2 g in sodium chloride 0.9 % 100 mL IVPB  2 g Intravenous On Call to OR Karie Soda, MD         Allergies  Allergen Reactions   Nsaids Other (See Comments)    Crohn's = avoid NSAIDs   Hydrocodone Itching   Phenazopyridine Hcl Itching    BP 136/61   Pulse (!) 51   Temp 97.9 F (36.6 C) (Oral)   Resp 16   SpO2 94%   Labs: No results found for this or any previous visit (from the past 48 hour(s)).  Imaging / Studies: No results found.   Ardeth Sportsman, M.D., F.A.C.S. Gastrointestinal and Minimally Invasive Surgery [facility] [address] [place], [place] [address] [phone] Main / Paging  01/30/2023 10:38 AM  Ardeth Sportsman

## 2023-01-30 NOTE — Anesthesia Preprocedure Evaluation (Signed)
Anesthesia Evaluation  Patient identified by MRN, date of birth, ID band Patient awake    Reviewed: Allergy & Precautions, H&P , NPO status , Patient's Chart, lab work & pertinent test results  Airway Mallampati: II  TM Distance: >3 FB Neck ROM: Full    Dental no notable dental hx.    Pulmonary COPD, Patient abstained from smoking., former smoker   Pulmonary exam normal breath sounds clear to auscultation       Cardiovascular hypertension, Pt. on medications Normal cardiovascular exam Rhythm:Regular Rate:Normal     Neuro/Psych   Anxiety     negative neurological ROS     GI/Hepatic negative GI ROS, Neg liver ROS,,,  Endo/Other  negative endocrine ROS    Renal/GU negative Renal ROS  negative genitourinary   Musculoskeletal negative musculoskeletal ROS (+)    Abdominal   Peds negative pediatric ROS (+)  Hematology negative hematology ROS (+)   Anesthesia Other Findings   Reproductive/Obstetrics negative OB ROS                             Anesthesia Physical Anesthesia Plan  ASA: 2  Anesthesia Plan: General   Post-op Pain Management: Tylenol PO (pre-op)*   Induction: Intravenous  PONV Risk Score and Plan: 2 and Ondansetron, Dexamethasone and Treatment may vary due to age or medical condition  Airway Management Planned: Oral ETT  Additional Equipment:   Intra-op Plan:   Post-operative Plan: Extubation in OR  Informed Consent: I have reviewed the patients History and Physical, chart, labs and discussed the procedure including the risks, benefits and alternatives for the proposed anesthesia with the patient or authorized representative who has indicated his/her understanding and acceptance.     Dental advisory given  Plan Discussed with: CRNA and Surgeon  Anesthesia Plan Comments:        Anesthesia Quick Evaluation

## 2023-01-30 NOTE — Consult Note (Signed)
H&P  Chief Complaint: Crohn's disease, history of bladder cancer  History of Present Illness: Zachary Oneal is a 72 year old male with a history of Crohn's disease and chronic small bowel abscess and fistula.  He presents today for bowel resection with Dr. Michaell Cowing and urology was requested to perform cystoscopy and firefly injection.  Patient also has a history of bladder cancer and saw Dr. Marlou Porch back in the office October 2024 and had a normal cystoscopy.  He has had no dysuria or gross hematuria.  He underwent a CT scan of the abdomen and pelvis August 2024 and October 2024.  This was a benign urinary tract.  The October 2024 read is still officially pending.   Past Surgical History:  Procedure Laterality Date   CYSTOSCOPY WITH RETROGRADE PYELOGRAM, URETEROSCOPY AND STENT PLACEMENT Bilateral 12/06/2015   Procedure: CYSTOSCOPY WITH BILATERAL RETROGRADE PYELOGRAM, LEFT URETERAL BALLOON DILITATION, LEFT URETEROSCOPY AND LEFT URETERAL BIOPSY AND LEFT URETERAL STENT PLACEMENT;  Surgeon: Crist Fat, MD;  Location: [facility];  Service: Urology;  Laterality: Bilateral;   FRACTURE SURGERY Right 1975   Right ankle   HERNIA REPAIR     Multiple inguinal and ventral repaired with mesh   TONSILLECTOMY     WISDOM TOOTH EXTRACTION      Home Medications:  Medications Prior to Admission  Medication Sig Dispense Refill Last Dose   amLODipine (NORVASC) 10 MG tablet Take 1 tablet (10 mg total) by mouth daily. 90 tablet 0 01/30/2023 at 0700   aspirin 81 MG chewable tablet Chew 81 mg by mouth daily.   01/23/2023   B Complex Vitamins (VITAMIN B COMPLEX) CAPS Take 1 capsule by mouth daily.   01/23/2023   bisoprolol (ZEBETA) 5 MG tablet TAKE 1 TABLET BY MOUTH EVERY DAY 90 tablet 0 01/30/2023 at 0700   Cholecalciferol (VITAMIN D3) 50 MCG (2000 UT) TABS Take 2,000 Units by mouth daily.   01/23/2023   citalopram (CELEXA) 20 MG tablet Take 20 mg by mouth daily.   01/30/2023 at 0700   fluticasone  (FLONASE) 50 MCG/ACT nasal spray Place 2 sprays into both nostrils daily as needed for allergies.   01/30/2023   hydrOXYzine (ATARAX/VISTARIL) 10 MG tablet Take 10 mg by mouth daily as needed for anxiety.   01/29/2023 at 1100   losartan (COZAAR) 25 MG tablet Take 1 tablet (25 mg total) by mouth every evening. 90 tablet 0 01/29/2023   mometasone-formoterol (DULERA) 100-5 MCG/ACT AERO Inhale 2 puffs into the lungs 2 (two) times daily as needed for shortness of breath.   01/30/2023 at 0700   Multiple Vitamins-Minerals (MULTIVITAMIN ADULTS 50+ PO) Take 2 tablets by mouth daily.   01/23/2023   rosuvastatin (CRESTOR) 20 MG tablet Take 20 mg by mouth daily.   01/30/2023 at 0700   traZODone (DESYREL) 50 MG tablet Take 25-100 mg by mouth at bedtime as needed for sleep.   Past Month   Allergies:  Allergies  Allergen Reactions   Nsaids Other (See Comments)    Crohn's = avoid NSAIDs   Hydrocodone Itching   Phenazopyridine Hcl Itching    Family History  Problem Relation Age of Onset   Cancer Mother        unknown type   Pancreatic cancer Father    Social History:  reports that he quit smoking 2 days ago. His smoking use included cigarettes. He has a 20 pack-year smoking history. He has never used smokeless tobacco. He reports current alcohol use. He reports that he does not  use drugs.  ROS: A complete review of systems was performed.  All systems are negative except for pertinent findings as noted. ROS   Physical Exam:  Vital signs in last 24 hours: Temp:  [97.9 F (36.6 C)] 97.9 F (36.6 C) (11/07 0915) Pulse Rate:  [51] 51 (11/07 0915) Resp:  [16] 16 (11/07 0915) BP: (136)/(61) 136/61 (11/07 0915) SpO2:  [94 %] 94 % (11/07 0915) General:  Alert and oriented, No acute distress HEENT: Normocephalic, atraumatic Cardiovascular: Regular rate and rhythm Lungs: Regular rate and effort Abdomen: Soft, nontender, nondistended, no abdominal masses Back: No CVA tenderness Extremities: No  edema Neurologic: Grossly intact  Laboratory Data:  No results found for this or any previous visit (from the past 24 hour(s)). No results found for this or any previous visit (from the past 240 hour(s)). Creatinine: No results for input(s): "CREATININE" in the last 168 hours.  Impression/Assessment:  Crohn's disease with small bowel fistula and abscess-history of bladder cancer-  Plan:  I discussed with the patient and his wife the nature, potential benefits, risks and alternatives to cystoscopy with firefly retrograde injection and Foley catheter placement, including side effects of the proposed treatment, the likelihood of the patient achieving the goals of the procedure, and any potential problems that might occur during the procedure or recuperation. All questions answered. Patient elects to proceed.    Zachary Oneal 01/30/2023

## 2023-01-30 NOTE — Anesthesia Postprocedure Evaluation (Signed)
Anesthesia Post Note  Patient: Zachary Oneal  Procedure(s) Performed: ROBOTIC ILEOCOLECTOMY PROXIMAL CYSTOSCOPY with FIREFLY INJECTION     Patient location during evaluation: PACU Anesthesia Type: General Level of consciousness: awake and alert Pain management: pain level controlled Vital Signs Assessment: post-procedure vital signs reviewed and stable Respiratory status: spontaneous breathing, nonlabored ventilation, respiratory function stable and patient connected to nasal cannula oxygen Cardiovascular status: blood pressure returned to baseline and stable Postop Assessment: no apparent nausea or vomiting Anesthetic complications: no  No notable events documented.  Last Vitals:  Vitals:   01/30/23 1500 01/30/23 1515  BP: (!) 143/68 137/73  Pulse: 64 (!) 59  Resp: 17 14  Temp:    SpO2: 93% 94%    Last Pain:  Vitals:   01/30/23 1515  TempSrc:   PainSc: 0-No pain                 Wilver Tignor S

## 2023-01-31 LAB — CBC
HCT: 45.3 % (ref 39.0–52.0)
Hemoglobin: 14.8 g/dL (ref 13.0–17.0)
MCH: 31.2 pg (ref 26.0–34.0)
MCHC: 32.7 g/dL (ref 30.0–36.0)
MCV: 95.6 fL (ref 80.0–100.0)
Platelets: 278 10*3/uL (ref 150–400)
RBC: 4.74 MIL/uL (ref 4.22–5.81)
RDW: 14.6 % (ref 11.5–15.5)
WBC: 13.1 10*3/uL — ABNORMAL HIGH (ref 4.0–10.5)
nRBC: 0 % (ref 0.0–0.2)

## 2023-01-31 LAB — BASIC METABOLIC PANEL
Anion gap: 9 (ref 5–15)
BUN: 14 mg/dL (ref 8–23)
CO2: 27 mmol/L (ref 22–32)
Calcium: 8.8 mg/dL — ABNORMAL LOW (ref 8.9–10.3)
Chloride: 101 mmol/L (ref 98–111)
Creatinine, Ser: 1.05 mg/dL (ref 0.61–1.24)
GFR, Estimated: >60 mL/min (ref >60)
Glucose, Bld: 139 mg/dL — ABNORMAL HIGH (ref 70–99)
Potassium: 4.4 mmol/L (ref 3.5–5.1)
Sodium: 137 mmol/L (ref 135–145)

## 2023-01-31 LAB — MAGNESIUM: Magnesium: 2 mg/dL (ref 1.7–2.4)

## 2023-01-31 MED ORDER — KCL IN DEXTROSE-NACL 20-5-0.45 MEQ/L-%-% IV SOLN
INTRAVENOUS | Status: AC
  Filled 2023-01-31 (×2): qty 1000

## 2023-01-31 MED ORDER — PANTOPRAZOLE SODIUM 40 MG PO TBEC
40.0000 mg | DELAYED_RELEASE_TABLET | Freq: Two times a day (BID) | ORAL | Status: DC
  Administered 2023-01-31 – 2023-02-03 (×7): 40 mg via ORAL
  Filled 2023-01-31 (×7): qty 1

## 2023-01-31 MED ORDER — PANTOPRAZOLE SODIUM 40 MG PO TBEC: 40.0000 mg | DELAYED_RELEASE_TABLET | Freq: Two times a day (BID) | ORAL | Status: DC

## 2023-01-31 MED ORDER — SIMETHICONE 80 MG PO CHEW
80.0000 mg | CHEWABLE_TABLET | Freq: Four times a day (QID) | ORAL | Status: AC
  Administered 2023-01-31 – 2023-02-02 (×11): 80 mg via ORAL
  Filled 2023-01-31 (×11): qty 1

## 2023-01-31 MED ORDER — BISACODYL 10 MG RE SUPP
10.0000 mg | Freq: Every day | RECTAL | Status: DC
  Filled 2023-01-31 (×3): qty 1

## 2023-01-31 MED ORDER — LORAZEPAM 2 MG/ML IJ SOLN: 0.5000 mg | Freq: Three times a day (TID) | INTRAMUSCULAR | Status: DC | PRN

## 2023-01-31 NOTE — Progress Notes (Signed)
01/31/2023  Zachary Oneal [id number] 1950-09-19  CARE TEAM: PCP: Daisy Floro, MD  Outpatient Care Team: Patient Care Team: Daisy Floro, MD as PCP - General (Family Medicine) Crist Fat, MD as Attending Physician (Urology) Kathi Der, MD as Consulting Physician (Gastroenterology) McGreal, Durene Romans, MD as Referring Physician (Gastroenterology) Karl Pock, PA-C as Physician Assistant (Gastroenterology) Josph Macho, MD as Consulting Physician (Oncology) Donnetta Hail, MD as Consulting Physician (Rheumatology) Karie Soda, MD as Consulting Physician (General Surgery)  Inpatient Treatment Team: Treatment Team:  Karie Soda, MD Jamesetta Orleans, LPN Bennetta Laos, RN Laural Benes, [place] M   Problem List:   Principal Problem:   Crohn's disease of ileum, with fistula [facility]) Active Problems:   Crohn's disease of ileum with fistula [facility])   Essential hypertension   COPD  II / active smoking   Acid indigestion   Anxiety   Immunocompromised state due to drug therapy [facility])   Mixed hyperlipidemia   Tobacco abuse   01/30/2023  POST-OPERATIVE DIAGNOSIS:  CROHNS DISEASE OF ILEUM WITH STRICTURE & FISTULAE   PROCEDURE:   ROBOTIC ILEOCOLONIC RESECTION INTRAOPERATIVE ASSESSMENT OF TISSUE VASCULAR PERFUSION USING ICG (indocyanine green) IMMUNOFLUORESCENCE TRANSVERSUS ABDOMINIS PLANE (TAP) BLOCK - BILATERAL   SURGEON:  Ardeth Sportsman, MD   OR FINDINGS:    Patient had thickened twisted ileocecal region consistent with chronic Crohn disease with phlegmon.  Appendix obliterated.  Distal ileal stricture 15 cm from ileocecal region as well.  Dilated ileum proximal to this with much less disease.  Colon appeared to be spared.  Sigmoid colon with mesentery adherent to obliterated appendix but no active fistula to the colon.  Rectosigmoid secondarily inflamed & did not seem to have any active Crohn's colitis or proctitis.  Therefore, no rectosigmoid  resection done.   It is an isoperistaltic ileocolonic (distal ileum to mid ascending colon) anastomosis that rests in the right upper quadrant hepatic flexure region.   CASE DATA: Type of patient?: Elective [facility] Private Case Status of Case? Elective Scheduled Infection Present At Time Of Surgery (PATOS)?  PHLEGMON    Assessment [facility] Stay = 1 days) 1 Day Post-Op    Nauseated with possible ileus    Plan:  Sips for now.  If feeling better later today can try clear liquids  PPI twice daily for complaints of heartburn.  Simethicone 4 times daily scheduled for the next few days.  Maalox for breakthrough.  If feels markedly worse, may need NG tube.  Try and hold off  Hypertension control.  -VTE prophylaxis- SCDs, etc  -mobilize as tolerated to help recovery.  Patient is motivated to get up.  -Disposition: TBD      I reviewed nursing notes, Consultant urology notes, last 24 h vitals and pain scores, last 48 h intake and output, last 24 h labs and trends, and last 24 h imaging results.  I have reviewed this patient's available data, including medical history, events of note, test results, etc as part of my evaluation.   A significant portion of that time was spent in counseling. Care during the described time interval was provided by me.  This care required moderate level of medical decision making.  01/31/2023    Subjective: (Chief complaint)  Patient felt good till 2AM in the morning and felt rather bloated with a lot of heartburn -wanting something stronger than Tums Denies much pain   Objective:  Vital signs:  Vitals:   01/30/23 2136 01/31/23 0025 01/31/23 0143 01/31/23 0536  BP: 122/66  (!) 130/59 (!) 134/59  Pulse: (!) 52  (!) 54 (!) 55  Resp: 18  18 18   Temp: (!) 97.5 F (36.4 C)  98 F (36.7 C) 97.9 F (36.6 C)  TempSrc:      SpO2: 96% 98% 93% 91%  Weight:    98.1 kg    Last BM Date : 01/29/23  Intake/Output   Yesterday:  11/07 0701 -  11/08 0700 In: 2470.8 [P.O.:840; I.V.:1530.8; IV Piggyback:100] Out: 950 [Urine:800; Blood:150] This shift:  No intake/output data recorded.  Bowel function:  Flatus: No  BM:  No  Drain: (No drain)   Physical Exam:  General: Pt awake/alert in no acute distress Eyes: PERRL, normal EOM.  Sclera clear.  No icterus Neuro: CN II-XII intact w/o focal sensory/motor deficits. Lymph: No head/neck/groin lymphadenopathy Psych:  No delerium/psychosis/paranoia.  Oriented x 4 HENT: Normocephalic, Mucus membranes moist.  No thrush Neck: Supple, No tracheal deviation.  No obvious thyromegaly Chest: No pain to chest wall compression.  Good respiratory excursion.  No audible wheezing CV:  Pulses intact.  Regular rhythm.  No major extremity edema MS: Normal AROM mjr joints.  No obvious deformity  Abdomen: Somewhat firm.  Moderately distended.  Mildly tender at incisions only.  No evidence of peritonitis.  No incarcerated hernias.  Ext:   No deformity.  No mjr edema.  No cyanosis Skin: No petechiae / purpurea.  No major sores.  Warm and dry    Results:   Cultures: No results found for this or any previous visit (from the past 720 hour(s)).  Labs: Results for orders placed or performed during the hospital encounter of 01/30/23 (from the past 48 hour(s))  ABO/Rh     Status: None   Collection Time: 01/30/23 10:00 AM  Result Value Ref Range   ABO/RH(D)      B POS Performed at [facility], [address], [place], [place] [address]   Basic metabolic panel     Status: Abnormal   Collection Time: 01/31/23  4:44 AM  Result Value Ref Range   Sodium 137 135 - 145 mmol/L   Potassium 4.4 3.5 - 5.1 mmol/L   Chloride 101 98 - 111 mmol/L   CO2 27 22 - 32 mmol/L   Glucose, Bld 139 (H) 70 - 99 mg/dL    Comment: Glucose reference range applies only to samples taken after fasting for at least 8 hours.   BUN 14 8 - 23 mg/dL   Creatinine, Ser 6.04 0.61 - 1.24 mg/dL   Calcium 8.8  (L) 8.9 - 10.3 mg/dL   GFR, Estimated >54 >09 mL/min    Comment: (NOTE) Calculated using the CKD-EPI Creatinine Equation (2021)    Anion gap 9 5 - 15    Comment: Performed at [facility], [address] [place], [place] [address]  CBC     Status: Abnormal   Collection Time: 01/31/23  4:44 AM  Result Value Ref Range   WBC 13.1 (H) 4.0 - 10.5 K/uL   RBC 4.74 4.22 - 5.81 MIL/uL   Hemoglobin 14.8 13.0 - 17.0 g/dL   HCT 47.8 29.5 - 62.1 %   MCV 95.6 80.0 - 100.0 fL   MCH 31.2 26.0 - 34.0 pg   MCHC 32.7 30.0 - 36.0 g/dL   RDW 30.8 65.7 - 84.6 %   Platelets 278 150 - 400 K/uL   nRBC 0.0 0.0 - 0.2 %    Comment: Performed at [facility], [address], [place], [place] [address]  Magnesium     Status: None   Collection Time: 01/31/23  4:44 AM  Result Value Ref Range   Magnesium 2.0 1.7 - 2.4 mg/dL    Comment: Performed at [facility], [address] [place], [place] [address]    Imaging / Studies: No results found.  Medications / Allergies: per chart  Antibiotics: Anti-infectives (From admission, onward)    Start     Dose/Rate Route Frequency Ordered Stop   01/30/23 2200  cefoTEtan (CEFOTAN) 2 g in sodium chloride 0.9 % 100 mL IVPB        2 g 200 mL/hr over 30 Minutes Intravenous Every 12 hours 01/30/23 1540 01/30/23 2150   01/30/23 1400  neomycin (MYCIFRADIN) tablet 1,000 mg  Status:  Discontinued       Placed in "And" Linked Group   1,000 mg Oral 3 times per day 01/30/23 0923 01/30/23 0926   01/30/23 1400  metroNIDAZOLE (FLAGYL) tablet 1,000 mg  Status:  Discontinued       Placed in "And" Linked Group   1,000 mg Oral 3 times per day 01/30/23 0923 01/30/23 0926   01/30/23 0930  cefoTEtan (CEFOTAN) 2 g in sodium chloride 0.9 % 100 mL IVPB        2 g 200 mL/hr over 30 Minutes Intravenous On call to O.R. 01/30/23 0923 01/30/23 1600         Note: Portions of this report may have been transcribed using voice  recognition software. Every effort was made to ensure accuracy; however, inadvertent computerized transcription errors may be present.   Any transcriptional errors that result from this process are unintentional.    Ardeth Sportsman, MD, FACS, MASCRS Esophageal, Gastrointestinal & Colorectal Surgery Robotic and Minimally Invasive Surgery  [facility] [address], [address] [place], [place] [address] [phone] Fax [phone] Main  CONTACT INFORMATION: Weekday (9AM-5PM): Call [facility] main office at [phone] Weeknight (5PM-9AM) or Weekend/Holiday: Check EPIC "Web Links" tab & use "AMION" (password " [email or web address]") for General Surgery [facility] coverage  Please, DO NOT use SecureChat  (it is not reliable communication to reach operating surgeons & will lead to a delay in care).   Epic staff messaging available for outptient concerns needing 1-2 business day response.      01/31/2023  7:57 AM

## 2023-01-31 NOTE — Progress Notes (Signed)
   01/31/23 0948  TOC Brief Assessment  Insurance and Status Reviewed  Patient has primary care physician Yes  Home environment has been reviewed home with spouse  Prior level of function: independent  Prior/Current Home Services No current home services  Social Determinants of Health Reivew SDOH reviewed no interventions necessary  Readmission risk has been reviewed Yes  Transition of care needs no transition of care needs at this time

## 2023-01-31 NOTE — Plan of Care (Signed)
  Problem: Education: Goal: Understanding of discharge needs will improve Outcome: Progressing Goal: Verbalization of understanding of the causes of altered bowel function will improve Outcome: Progressing   Problem: Activity: Goal: Ability to tolerate increased activity will improve Outcome: Progressing   Problem: Bowel/Gastric: Goal: Gastrointestinal status for postoperative course will improve Outcome: Progressing   Problem: Nutritional: Goal: Will attain and maintain optimal nutritional status will improve Outcome: Progressing   Problem: Clinical Measurements: Goal: Postoperative complications will be avoided or minimized Outcome: Progressing

## 2023-01-31 NOTE — Progress Notes (Signed)
Mobility Specialist - Progress Note   01/31/23 0945  Mobility  Activity Ambulated with assistance in hallway  Level of Assistance Modified independent, requires aide device or extra time  Assistive Device Other (Comment) (IV Pole)  Distance Ambulated (ft) 500 ft  Activity Response Tolerated well  Mobility Referral Yes  $Mobility charge 1 Mobility  Mobility Specialist Start Time (ACUTE ONLY) [id number]  Mobility Specialist Stop Time (ACUTE ONLY) 0906  Mobility Specialist Time Calculation (min) (ACUTE ONLY) 10 min   Pt received in bed and agreeable to mobility. No complaints during session. Pt to EOB after session with all needs met. RN in room.   [facility]

## 2023-02-01 NOTE — Plan of Care (Signed)
  Problem: Health Behavior/Discharge Planning: Goal: Identification of community resources to assist with postoperative recovery needs will improve Outcome: Progressing   Problem: Nutritional: Goal: Will attain and maintain optimal nutritional status will improve Outcome: Progressing   Problem: Clinical Measurements: Goal: Postoperative complications will be avoided or minimized Outcome: Progressing

## 2023-02-01 NOTE — Progress Notes (Signed)
Pharmacy Brief Note - Alvimopan (Entereg)  The standing order set for alvimopan (Entereg) now includes an automatic order to discontinue the drug after the patient has had a bowel movement. The change was approved by the Pharmacy & Therapeutics Committee and the Medical Executive Committee.   This patient has had bowel movements documented by nursing. Therefore, alvimopan has been discontinued. If there are questions, please contact the pharmacy at [phone].   Thank you-   Adalberto Cole, PharmD, BCPS 02/01/2023 12:33 PM

## 2023-02-01 NOTE — Plan of Care (Signed)
  Problem: Education: Goal: Understanding of discharge needs will improve Outcome: Progressing   Problem: Activity: Goal: Ability to tolerate increased activity will improve Outcome: Progressing   Problem: Bowel/Gastric: Goal: Gastrointestinal status for postoperative course will improve Outcome: Progressing   Problem: Health Behavior/Discharge Planning: Goal: Identification of community resources to assist with postoperative recovery needs will improve Outcome: Progressing   Problem: Clinical Measurements: Goal: Postoperative complications will be avoided or minimized Outcome: Progressing   Problem: Respiratory: Goal: Respiratory status will improve Outcome: Progressing

## 2023-02-01 NOTE — Progress Notes (Signed)
2 Days Post-Op   Subjective/Chief Complaint: Pain controlled Feels less bloated Had bm's   Objective: Vital signs in last 24 hours: Temp:  [97.4 F (36.3 C)-98 F (36.7 C)] 98 F (36.7 C) (11/09 0533) Pulse Rate:  [51-59] 54 (11/09 0533) Resp:  [15-18] 15 (11/09 0533) BP: (107-142)/(63-68) 142/68 (11/09 0533) SpO2:  [92 %-97 %] 97 % (11/09 0831) Weight:  [85.3 kg] 85.3 kg (11/09 0500) Last BM Date : 01/31/23 (very small, per pt)  Intake/Output from previous day: 11/08 0701 - 11/09 0700 In: 1886.7 [P.O.:790; I.V.:1096.7] Out: 200 [Urine:200] Intake/Output this shift: No intake/output data recorded.  Exam: Awake and alert Comfortable Abdomen soft, non-distended, incisions clean, minimal tenderness  Lab Results:  Recent Labs    01/31/23 0444  WBC 13.1*  HGB 14.8  HCT 45.3  PLT 278   BMET Recent Labs    01/31/23 0444  NA 137  K 4.4  CL 101  CO2 27  GLUCOSE 139*  BUN 14  CREATININE 1.05  CALCIUM 8.8*   PT/INR No results for input(s): "LABPROT", "INR" in the last 72 hours. ABG No results for input(s): "PHART", "HCO3" in the last 72 hours.  Invalid input(s): "PCO2", "PO2"  Studies/Results: No results found.  Anti-infectives: Anti-infectives (From admission, onward)    Start     Dose/Rate Route Frequency Ordered Stop   01/30/23 2200  cefoTEtan (CEFOTAN) 2 g in sodium chloride 0.9 % 100 mL IVPB        2 g 200 mL/hr over 30 Minutes Intravenous Every 12 hours 01/30/23 1540 01/31/23 0700   01/30/23 1400  neomycin (MYCIFRADIN) tablet 1,000 mg  Status:  Discontinued       Placed in "And" Linked Group   1,000 mg Oral 3 times per day 01/30/23 0923 01/30/23 0926   01/30/23 1400  metroNIDAZOLE (FLAGYL) tablet 1,000 mg  Status:  Discontinued       Placed in "And" Linked Group   1,000 mg Oral 3 times per day 01/30/23 0923 01/30/23 0926   01/30/23 0930  cefoTEtan (CEFOTAN) 2 g in sodium chloride 0.9 % 100 mL IVPB        2 g 200 mL/hr over 30 Minutes  Intravenous On call to O.R. 01/30/23 0923 01/30/23 1600       Assessment/Plan: s/p Procedure(s) with comments: ROBOTIC ILEOCOLECTOMY PROXIMAL (N/A) - 210 TOTAL INCLUDING [facility] CYSTOSCOPY with FIREFLY INJECTION (N/A)  Doing better Will advance diet Saline lock IV   LOS: 2 days    Zachary Oneal 02/01/2023

## 2023-02-02 NOTE — Progress Notes (Signed)
3 Days Post-Op   Subjective/Chief Complaint: Patient reports still feeling bloated but having BM's Feels like food is getting hung up when he swallows   Objective: Vital signs in last 24 hours: Temp:  [97.7 F (36.5 C)-98 F (36.7 C)] 98 F (36.7 C) (11/10 0538) Pulse Rate:  [50-60] 50 (11/10 0538) Resp:  [16-18] 18 (11/10 0538) BP: (141-156)/(66-71) 156/66 (11/10 0538) SpO2:  [94 %-97 %] 94 % (11/10 0538) Weight:  [83.2 kg] 83.2 kg (11/10 0500) Last BM Date : 02/01/23  Intake/Output from previous day: 11/09 0701 - 11/10 0700 In: 1320 [P.O.:1320] Out: 300 [Urine:300] Intake/Output this shift: No intake/output data recorded.  Exam: Awake and alert Comfortable Abdomen soft, just a little full, minimal tenderness  Lab Results:  Recent Labs    01/31/23 0444  WBC 13.1*  HGB 14.8  HCT 45.3  PLT 278   BMET Recent Labs    01/31/23 0444  NA 137  K 4.4  CL 101  CO2 27  GLUCOSE 139*  BUN 14  CREATININE 1.05  CALCIUM 8.8*   PT/INR No results for input(s): "LABPROT", "INR" in the last 72 hours. ABG No results for input(s): "PHART", "HCO3" in the last 72 hours.  Invalid input(s): "PCO2", "PO2"  Studies/Results: No results found.  Anti-infectives: Anti-infectives (From admission, onward)    Start     Dose/Rate Route Frequency Ordered Stop   01/30/23 2200  cefoTEtan (CEFOTAN) 2 g in sodium chloride 0.9 % 100 mL IVPB        2 g 200 mL/hr over 30 Minutes Intravenous Every 12 hours 01/30/23 1540 01/31/23 0700   01/30/23 1400  neomycin (MYCIFRADIN) tablet 1,000 mg  Status:  Discontinued       Placed in "And" Linked Group   1,000 mg Oral 3 times per day 01/30/23 0923 01/30/23 0926   01/30/23 1400  metroNIDAZOLE (FLAGYL) tablet 1,000 mg  Status:  Discontinued       Placed in "And" Linked Group   1,000 mg Oral 3 times per day 01/30/23 0923 01/30/23 0926   01/30/23 0930  cefoTEtan (CEFOTAN) 2 g in sodium chloride 0.9 % 100 mL IVPB        2 g 200 mL/hr over 30  Minutes Intravenous On call to O.R. 01/30/23 0923 01/30/23 1600       Assessment/Plan: s/p Procedure(s) with comments: ROBOTIC ILEOCOLECTOMY PROXIMAL (N/A) - 210 TOTAL INCLUDING [facility] CYSTOSCOPY with FIREFLY INJECTION (N/A)   Given bloating and swallowing complaints, pt not ready for discharge today and pt wants to stay. Continue current postop care  LOS: 3 days    Abigail Miyamoto MD 02/02/2023

## 2023-02-02 NOTE — Plan of Care (Signed)
  Problem: Education: Goal: Understanding of discharge needs will improve Outcome: Progressing   Problem: Activity: Goal: Ability to tolerate increased activity will improve Outcome: Progressing   Problem: Bowel/Gastric: Goal: Gastrointestinal status for postoperative course will improve Outcome: Progressing   Problem: Nutritional: Goal: Will attain and maintain optimal nutritional status will improve Outcome: Progressing   Problem: Clinical Measurements: Goal: Postoperative complications will be avoided or minimized Outcome: Progressing   Problem: Respiratory: Goal: Respiratory status will improve Outcome: Progressing

## 2023-02-02 NOTE — Plan of Care (Signed)
  Problem: Activity: Goal: Ability to tolerate increased activity will improve Outcome: Progressing   Problem: Bowel/Gastric: Goal: Gastrointestinal status for postoperative course will improve Outcome: Progressing   Problem: Health Behavior/Discharge Planning: Goal: Identification of community resources to assist with postoperative recovery needs will improve Outcome: Progressing   Problem: Nutritional: Goal: Will attain and maintain optimal nutritional status will improve Outcome: Progressing

## 2023-02-03 LAB — SURGICAL PATHOLOGY

## 2023-02-03 MED ORDER — PANTOPRAZOLE SODIUM 40 MG PO TBEC: 40.0000 mg | DELAYED_RELEASE_TABLET | Freq: Every day | ORAL | Status: DC

## 2023-02-03 MED ORDER — SIMETHICONE 80 MG PO CHEW
80.0000 mg | CHEWABLE_TABLET | Freq: Four times a day (QID) | ORAL | Status: DC
  Administered 2023-02-03: 80 mg via ORAL
  Filled 2023-02-03: qty 1

## 2023-02-03 NOTE — Plan of Care (Signed)
  Problem: Education: Goal: Understanding of discharge needs will improve Outcome: Progressing   Problem: Activity: Goal: Ability to tolerate increased activity will improve Outcome: Progressing

## 2023-02-03 NOTE — Progress Notes (Signed)
Discharge instructions given to patient and all questions were answered.  

## 2023-02-03 NOTE — Discharge Summary (Signed)
Physician Discharge Summary    Zachary Oneal MRN: [id number] DOB/AGE: 03/30/1950 = 72 y.o.  Patient Care Team: Daisy Floro, MD as PCP - General (Family Medicine) Crist Fat, MD as Attending Physician (Urology) Kathi Der, MD as Consulting Physician (Gastroenterology) McGreal, Durene Romans, MD as Referring Physician (Gastroenterology) Karl Pock, PA-C as Physician Assistant (Gastroenterology) Josph Macho, MD as Consulting Physician (Oncology) Donnetta Hail, MD as Consulting Physician (Rheumatology) Karie Soda, MD as Consulting Physician (General Surgery)  Admit date: 01/30/2023  Discharge date: 02/03/2023  Hospital Stay = 4 days    Discharge Diagnoses:  Principal Problem:   Crohn's disease of ileum, with fistula (HCC) Active Problems:   Crohn's disease of ileum with fistula (HCC)   Essential hypertension   COPD  II / active smoking   Acid indigestion   Anxiety   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  Appendix obliterated.  Distal ileal stricture 15 cm from ileocecal region as well.  Dilated ileum proximal to this with much less disease.  Colon appeared to be spared.  Sigmoid colon with mesentery adherent to obliterated appendix but no active fistula to the colon.  Rectosigmoid secondarily inflamed & did not seem to have any active Crohn's colitis or proctitis.  Therefore, no rectosigmoid resection done.   It is an  isoperistaltic ileocolonic (distal ileum to mid ascending colon) anastomosis that rests in the right upper quadrant hepatic flexure region.   CASE DATA: Type of patient?: Elective [facility] Private Case Status of Case? Elective Scheduled Infection Present At Time Of Surgery (PATOS)?  PHLEGMON  Consults: Case Management / Social Work, Administrator, arts, Nutrition, and Anesthesia  Hospital Course:   The patient underwent the surgery above.  Postoperatively, the patient gradually mobilized and advanced to a solid diet.  Patient did have some heartburn at stage at first but once his bowels started moving that rapidly improved.  Pain and other symptoms were treated aggressively.    By the time of discharge, the patient was walking well the hallways, eating food, having flatus.  Pain was well-controlled on an oral medications.  Based on meeting discharge criteria and continuing to recover, I felt it was safe for the patient to be discharged from the hospital to further recover with close followup. Postoperative recommendations were discussed in detail.  They are written as well.  Discharged Condition: good  Discharge Exam: Blood pressure (!) 141/61, pulse (!) 44, temperature (!) 97.4 F (36.3 C), temperature source Oral, resp. rate 18, weight 83.2 kg, SpO2 91%.  General: Pt awake/alert/oriented x4 in No acute distress Eyes: PERRL, normal EOM.  Sclera clear.  No icterus Neuro: CN II-XII intact w/o focal sensory/motor deficits. Lymph: No head/neck/groin lymphadenopathy Psych:  No delerium/psychosis/paranoia HENT: Normocephalic, Mucus membranes moist.  No thrush Neck: Supple, No tracheal deviation Chest:  No chest wall pain w good excursion CV:  Pulses intact.  Regular rhythm MS: Normal AROM mjr joints.  No obvious deformity Abdomen: Soft.  Nondistended.  Mildly tender at incisions only.  No evidence of  peritonitis.  No incarcerated hernias. GU.  Foley catheter out.  Mild left groin discomfort without any  recurrent hernias.  Guarding or point tenderness. Ext:  SCDs BLE.  No mjr edema.  No cyanosis Skin: No petechiae / purpura   Disposition:    Follow-up Information     Karie Soda, MD Follow up on 02/17/2023.   Specialties: General Surgery, Colon and Rectal Surgery Why: To follow up after your hospital stay, To follow up after your operation Contact information: [address] [place] [address] [phone]         Kathi Der, MD. Schedule an appointment as soon as possible for a visit in 3 week(s).   Specialty: Gastroenterology Why: To follow-up on surgery for Crohn's and see if okay to start [facility] versus [facility] Contact information: [address] [place] [address] [phone]                 Discharge disposition: 01-Home or Self Care       Discharge Instructions     Call MD for:   Complete by: As directed    FEVER > 101.5 F  (temperatures < 101.5 F are not significant)   Call MD for:  extreme fatigue   Complete by: As directed    Call MD for:  persistant dizziness or light-headedness   Complete by: As directed    Call MD for:  persistant nausea and vomiting   Complete by: As directed    Call MD for:  redness, tenderness, or signs of infection (pain, swelling, redness, odor or green/yellow discharge around incision site)   Complete by: As directed    Call MD for:  severe uncontrolled pain   Complete by: As directed    Diet - low sodium heart healthy   Complete by: As directed    Start with a bland diet such as soups, liquids, starchy foods, low fat foods, etc. the first few days at home. Gradually advance to a solid, low-fat, high fiber diet by the end of the first week at home.   Add a fiber supplement to your diet (Metamucil, etc) If you feel full, bloated, or constipated, stay on a full liquid or pureed/blenderized diet for a few days until you feel better and are no longer constipated.   Discharge  instructions   Complete by: As directed    See Discharge Instructions If you are not getting better after two weeks or are noticing you are getting worse, contact our office [phone] for further advice.  We may need to adjust your medications, re-evaluate you in the office, send you to the emergency room, or see what other things we can do to help. The clinic staff is available to answer your questions during regular business hours (8:30am-5pm).  Please don't hesitate to call and ask to speak to one of our nurses for clinical concerns.    A surgeon from [facility] is always on call at the hospitals 24 hours/day If you have a medical emergency, go to the nearest emergency room or call 911.   Discharge wound care:   Complete by: As directed    It is good for closed incisions and even open wounds to be washed every day.  Shower every day.  Short baths are fine.  Wash the incisions and wounds clean with soap & water.    You may leave closed incisions open to air if it is dry.   You  may cover the incision with clean gauze & replace it after your daily shower for comfort.  TEGADERM:  You have clear gauze band-aid dressings over your closed incision(s).  Remove the dressings 3 days after surgery = 11/10 Sunday   Driving Restrictions   Complete by: As directed    You may drive when: - you are no longer taking narcotic prescription pain medication - you can comfortably wear a seatbelt - you can safely make sudden turns/stops without pain.   Increase activity slowly   Complete by: As directed    Start light daily activities --- self-care, walking, climbing stairs- beginning the day after surgery.  Gradually increase activities as tolerated.  Control your pain to be active.  Stop when you are tired.  Ideally, walk several times a day, eventually an hour a day.   Most people are back to most day-to-day activities in a few weeks.  It takes 4-6 weeks to get back to unrestricted, intense  activity. If you can walk 30 minutes without difficulty, it is safe to try more intense activity such as jogging, treadmill, bicycling, low-impact aerobics, swimming, etc. Save the most intensive and strenuous activity for last (Usually 4-8 weeks after surgery) such as sit-ups, heavy lifting, contact sports, etc.  Refrain from any intense heavy lifting or straining until you are off narcotics for pain control.  You will have off days, but things should improve week-by-week. DO NOT PUSH THROUGH PAIN.  Let pain be your guide: If it hurts to do something, don't do it.   Lifting restrictions   Complete by: As directed    If you can walk 30 minutes without difficulty, it is safe to try more intense activity such as jogging, treadmill, bicycling, low-impact aerobics, swimming, etc. Save the most intensive and strenuous activity for last (Usually 4-8 weeks after surgery) such as sit-ups, heavy lifting, contact sports, etc.   Refrain from any intense heavy lifting or straining until you are off narcotics for pain control.  You will have off days, but things should improve week-by-week. DO NOT PUSH THROUGH PAIN.  Let pain be your guide: If it hurts to do something, don't do it.  Pain is your body warning you to avoid that activity for another week until the pain goes down.   May shower / Bathe   Complete by: As directed    May walk up steps   Complete by: As directed    Remove dressing in 72 hours   Complete by: As directed    Make sure all dressings are removed by the third day after surgery.  Leave incisions open to air.  OK to cover incisions with gauze or bandages as desired   Sexual Activity Restrictions   Complete by: As directed    You may have sexual intercourse when it is comfortable. If it hurts to do something, stop.       Allergies as of 02/03/2023       Reactions   Nsaids Other (See Comments)   Crohn's = avoid NSAIDs   Hydrocodone Itching   Phenazopyridine Hcl Itching         Medication List     TAKE these medications    amLODipine 10 MG tablet Commonly known as: NORVASC Take 1 tablet (10 mg total) by mouth daily.   aspirin 81 MG chewable tablet Chew 81 mg by mouth daily.   bisoprolol 5 MG tablet Commonly known as: ZEBETA TAKE 1 TABLET BY MOUTH EVERY DAY  citalopram 20 MG tablet Commonly known as: CELEXA Take 20 mg by mouth daily.   Dulera 100-5 MCG/ACT Aero Generic drug: mometasone-formoterol Inhale 2 puffs into the lungs 2 (two) times daily as needed for shortness of breath.   fluticasone 50 MCG/ACT nasal spray Commonly known as: FLONASE Place 2 sprays into both nostrils daily as needed for allergies.   hydrOXYzine 10 MG tablet Commonly known as: ATARAX Take 10 mg by mouth daily as needed for anxiety.   losartan 25 MG tablet Commonly known as: COZAAR Take 1 tablet (25 mg total) by mouth every evening.   MULTIVITAMIN ADULTS 50+ PO Take 2 tablets by mouth daily.   rosuvastatin 20 MG tablet Commonly known as: CRESTOR Take 20 mg by mouth daily.   traMADol 50 MG tablet Commonly known as: ULTRAM Take 1-2 tablets (50-100 mg total) by mouth every 6 (six) hours as needed for moderate pain (pain score 4-6) or severe pain (pain score 7-10).   traZODone 50 MG tablet Commonly known as: DESYREL Take 25-100 mg by mouth at bedtime as needed for sleep.   Vitamin B Complex Caps Take 1 capsule by mouth daily.   Vitamin D3 50 MCG (2000 UT) Tabs Take 2,000 Units by mouth daily.               Discharge Care Instructions  (From admission, onward)           Start     Ordered   01/30/23 0000  Discharge wound care:       Comments: It is good for closed incisions and even open wounds to be washed every day.  Shower every day.  Short baths are fine.  Wash the incisions and wounds clean with soap & water.    You may leave closed incisions open to air if it is dry.   You may cover the incision with clean gauze & replace it after your  daily shower for comfort.  TEGADERM:  You have clear gauze band-aid dressings over your closed incision(s).  Remove the dressings 3 days after surgery = 11/10 Sunday   01/30/23 1213            Significant Diagnostic Studies:  No results found for this or any previous visit (from the past 72 hour(s)).  No results found.  Past Medical History:  Diagnosis Date   Anxiety    Arthritis    COPD (chronic obstructive pulmonary disease) (HCC)    Coronary artery disease    GERD (gastroesophageal reflux disease)    Hypertension    Leukocytosis    Dr. Myna Hidalgo sees him yearly   Research study patient: ACCLAIM-Lp(a)) clinical trial  10/14/2022 10/14/2022   Patient has randomized into the Lepodisiran on the Reduction of MACE w/ Elevated Lp(a) in Established CVD or High Risk for CVD ([id number]-[id number]-[id number]-ACCLAIM-Lp(a)) clinical trial  10/14/2022     Ureteral mass    Wears glasses     Past Surgical History:  Procedure Laterality Date   CYSTOSCOPY WITH RETROGRADE PYELOGRAM, URETEROSCOPY AND STENT PLACEMENT Bilateral 12/06/2015   Procedure: CYSTOSCOPY WITH BILATERAL RETROGRADE PYELOGRAM, LEFT URETERAL BALLOON DILITATION, LEFT URETEROSCOPY AND LEFT URETERAL BIOPSY AND LEFT URETERAL STENT PLACEMENT;  Surgeon: Crist Fat, MD;  Location[facility];  Service: Urology;  Laterality: Bilateral;   FRACTURE SURGERY Right 1975   Right ankle   HERNIA REPAIR     Multiple inguinal and ventral repaired with mesh   TONSILLECTOMY     WISDOM TOOTH EXTRACTION  Social History   Socioeconomic History   Marital status: Married    Spouse name: Not on file   Number of children: Not on file   Years of education: Not on file   Highest education level: Not on file  Occupational History   Not on file  Tobacco Use   Smoking status: Former    Current packs/day: 0.00    Average packs/day: 1 pack/day for 20.0 years (20.0 ttl pk-yrs)    Types: Cigarettes    Quit date: 01/28/2023    Years  since quitting: 0.0   Smokeless tobacco: Never  Vaping Use   Vaping status: Never Used  Substance and Sexual Activity   Alcohol use: Yes    Alcohol/week: 0.0 standard drinks of alcohol    Comment: socially   Drug use: Never   Sexual activity: Not Currently  Other Topics Concern   Not on file  Social History Narrative   Originally from [place], [place]   Social Determinants of Health   Financial Resource Strain: Not on file  Food Insecurity: Patient Declined (01/30/2023)   Hunger Vital Sign    Worried About Running Out of Food in the Last Year: Patient declined    Ran Out of Food in the Last Year: Patient declined  Transportation Needs: No Transportation Needs (01/30/2023)   PRAPARE - Administrator, Civil Service (Medical): No    Lack of Transportation (Non-Medical): No  Physical Activity: Not on file  Stress: Not on file  Social Connections: Not on file  Intimate Partner Violence: Not At Risk (01/30/2023)   Humiliation, Afraid, Rape, and Kick questionnaire    Fear of Current or Ex-Partner: No    Emotionally Abused: No    Physically Abused: No    Sexually Abused: No    Family History  Problem Relation Age of Onset   Cancer Mother        unknown type   Pancreatic cancer Father     Current Facility-Administered Medications  Medication Dose Route Frequency Provider Last Rate Last Admin   0.9 %  sodium chloride infusion  250 mL Intravenous PRN Karie Soda, MD       acetaminophen (TYLENOL) tablet 1,000 mg  1,000 mg Oral Trecia Rogers, MD   1,000 mg at 02/03/23 0545   alum & mag hydroxide-simeth (MAALOX/MYLANTA) 200-200-20 MG/5ML suspension 30 mL  30 mL Oral Q6H PRN Karie Soda, MD   30 mL at 01/31/23 2137   aspirin chewable tablet 81 mg  81 mg Oral Daily Karie Soda, MD   81 mg at 02/03/23 0800   B-complex with vitamin C tablet 1 tablet  1 tablet Oral Daily Karie Soda, MD   1 tablet at 02/03/23 0803   bisacodyl (DULCOLAX) suppository 10 mg  10  mg Rectal Daily Karie Soda, MD       bisoprolol (ZEBETA) tablet 5 mg  5 mg Oral Daily Karie Soda, MD   5 mg at 02/03/23 0800   cholecalciferol (VITAMIN D3) 25 MCG (1000 UNIT) tablet 2,000 Units  2,000 Units Oral Daily Karie Soda, MD   2,000 Units at 02/03/23 0800   citalopram (CELEXA) tablet 20 mg  20 mg Oral Daily Karie Soda, MD   20 mg at 02/03/23 0800   diphenhydrAMINE (BENADRYL) 12.5 MG/5ML elixir 12.5 mg  12.5 mg Oral Q6H PRN Karie Soda, MD   12.5 mg at 01/30/23 2108   Or   diphenhydrAMINE (BENADRYL) injection 12.5 mg  12.5 mg Intravenous  Q6H PRN Karie Soda, MD       enoxaparin (LOVENOX) injection 40 mg  40 mg Subcutaneous Q24H Karie Soda, MD   40 mg at 02/03/23 0759   feeding supplement (ENSURE SURGERY) liquid 237 mL  237 mL Oral BID BM Karie Soda, MD   237 mL at 02/02/23 1321   fluticasone (FLONASE) 50 MCG/ACT nasal spray 2 spray  2 spray Each Nare Daily PRN Karie Soda, MD       hydrALAZINE (APRESOLINE) injection 10 mg  10 mg Intravenous Q2H PRN Karie Soda, MD       HYDROmorphone (DILAUDID) injection 0.5-2 mg  0.5-2 mg Intravenous Q4H PRN Karie Soda, MD   1 mg at 01/31/23 5784   hydrOXYzine (ATARAX) tablet 10 mg  10 mg Oral Daily PRN Karie Soda, MD       LORazepam (ATIVAN) injection 0.5-1 mg  0.5-1 mg Intravenous Q8H PRN Karie Soda, MD       losartan (COZAAR) tablet 25 mg  25 mg Oral QPM Karie Soda, MD   25 mg at 02/02/23 1702   magic mouthwash  15 mL Oral QID PRN Karie Soda, MD       menthol-cetylpyridinium (CEPACOL) lozenge 3 mg  1 lozenge Oral PRN Karie Soda, MD       methocarbamol (ROBAXIN) injection 1,000 mg  1,000 mg Intravenous Q6H PRN Karie Soda, MD       Or   methocarbamol (ROBAXIN) tablet 1,000 mg  1,000 mg Oral Q6H PRN Karie Soda, MD   1,000 mg at 01/31/23 2137   metoprolol tartrate (LOPRESSOR) injection 5 mg  5 mg Intravenous Q6H PRN Karie Soda, MD       mometasone-formoterol (DULERA) 100-5 MCG/ACT inhaler 2 puff  2 puff  Inhalation BID Karie Soda, MD   2 puff at 02/01/23 1807   naphazoline-glycerin (CLEAR EYES REDNESS) ophth solution 1-2 drop  1-2 drop Both Eyes QID PRN Karie Soda, MD   2 drop at 02/01/23 1107   ondansetron (ZOFRAN) tablet 4 mg  4 mg Oral Q6H PRN Karie Soda, MD       Or   ondansetron [facility]) injection 4 mg  4 mg Intravenous Q6H PRN Karie Soda, MD       Melene Muller ON 02/04/2023] pantoprazole (PROTONIX) EC tablet 40 mg  40 mg Oral Daily Karie Soda, MD       phenol (CHLORASEPTIC) mouth spray 2 spray  2 spray Mouth/Throat PRN Karie Soda, MD       polycarbophil (FIBERCON) tablet 625 mg  625 mg Oral BID Karie Soda, MD   625 mg at 02/03/23 0759   prochlorperazine (COMPAZINE) tablet 10 mg  10 mg Oral Q6H PRN Karie Soda, MD       Or   prochlorperazine (COMPAZINE) injection 5-10 mg  5-10 mg Intravenous Q6H PRN Karie Soda, MD       rosuvastatin (CRESTOR) tablet 20 mg  20 mg Oral Daily Karie Soda, MD   20 mg at 02/03/23 0800   simethicone (MYLICON) chewable tablet 80 mg  80 mg Oral QID Karie Soda, MD   80 mg at 02/02/23 2203   simethicone (MYLICON) chewable tablet 80 mg  80 mg Oral QID Karie Soda, MD   80 mg at 02/03/23 0800   sodium chloride (OCEAN) 0.65 % nasal spray 1-2 spray  1-2 spray Each Nare Q6H PRN Karie Soda, MD       sodium chloride flush (NS) 0.9 % injection 3 mL  3 mL Intravenous Q12H  Karie Soda, MD   3 mL at 02/02/23 2203   sodium chloride flush (NS) 0.9 % injection 3 mL  3 mL Intravenous PRN Karie Soda, MD       traMADol Janean Sark) tablet 50-100 mg  50-100 mg Oral Q6H PRN Karie Soda, MD   100 mg at 02/02/23 1702   traZODone (DESYREL) tablet 25-100 mg  25-100 mg Oral QHS PRN Karie Soda, MD         Allergies  Allergen Reactions   Nsaids Other (See Comments)    Crohn's = avoid NSAIDs   Hydrocodone Itching   Phenazopyridine Hcl Itching    Signed:   Ardeth Sportsman, MD, FACS, MASCRS Esophageal, Gastrointestinal & Colorectal Surgery Robotic  and Minimally Invasive Surgery  [facility] [address] [place], [place] [address] [phone] Fax [phone] Main  CONTACT INFORMATION: Weekday (9AM-5PM): Call [facility] main office at [phone] Weeknight (5PM-9AM) or Weekend/Holiday: Check EPIC "Web Links" tab & use "AMION" (password " [email or web address]") for General Surgery [facility] coverage  Please, DO NOT use SecureChat  (it is not reliable communication to reach operating surgeons & will lead to a delay in care).   Epic staff messaging available for outptient concerns needing 1-2 business day response.      02/03/2023, 8:16 AM

## 2023-02-14 ENCOUNTER — Other Ambulatory Visit: Payer: Self-pay | Admitting: Cardiology

## 2023-02-14 DIAGNOSIS — I10 Essential (primary) hypertension: Secondary | ICD-10-CM

## 2023-02-14 DIAGNOSIS — I6523 Occlusion and stenosis of bilateral carotid arteries: Secondary | ICD-10-CM

## 2023-02-18 ENCOUNTER — Other Ambulatory Visit: Payer: Self-pay | Admitting: Acute Care

## 2023-02-18 DIAGNOSIS — Z87891 Personal history of nicotine dependence: Secondary | ICD-10-CM

## 2023-02-18 DIAGNOSIS — F17210 Nicotine dependence, cigarettes, uncomplicated: Secondary | ICD-10-CM

## 2023-02-27 DIAGNOSIS — K50013 Crohn's disease of small intestine with fistula: Secondary | ICD-10-CM | POA: Diagnosis not present

## 2023-02-27 DIAGNOSIS — Z9049 Acquired absence of other specified parts of digestive tract: Secondary | ICD-10-CM | POA: Diagnosis not present

## 2023-02-27 DIAGNOSIS — K50014 Crohn's disease of small intestine with abscess: Secondary | ICD-10-CM | POA: Diagnosis not present

## 2023-03-24 ENCOUNTER — Ambulatory Visit
Admission: RE | Admit: 2023-03-24 | Discharge: 2023-03-24 | Disposition: A | Discharge status: Home / Self Care | Payer: Medicare Other | Source: Ambulatory Visit | Attending: Acute Care | Admitting: Acute Care

## 2023-03-24 DIAGNOSIS — F17210 Nicotine dependence, cigarettes, uncomplicated: Secondary | ICD-10-CM | POA: Diagnosis not present

## 2023-03-24 DIAGNOSIS — Z87891 Personal history of nicotine dependence: Secondary | ICD-10-CM

## 2023-04-04 ENCOUNTER — Telehealth: Payer: Self-pay | Admitting: *Deleted

## 2023-04-04 NOTE — Telephone Encounter (Signed)
 NFN

## 2023-04-04 NOTE — Telephone Encounter (Signed)
Call report from [facility].

## 2023-04-04 NOTE — Telephone Encounter (Signed)
 Spoke with Dwayne, call report below.   IMPRESSION: 1. Lung-RADS 3, probably benign findings. Short-term follow-up in 6 months is recommended with repeat low-dose chest CT without contrast (please use the following order, CT CHEST LCS NODULE FOLLOW-UP W/O CM). New indistinct posterior right middle lobe pulmonary nodule measuring 5.3 mm in volume derived mean diameter. 2. Three-vessel coronary atherosclerosis. 3. Aortic Atherosclerosis (ICD10-I70.0) and Emphysema (ICD10-J43.9).

## 2023-04-07 ENCOUNTER — Other Ambulatory Visit: Payer: Self-pay

## 2023-04-07 DIAGNOSIS — Z122 Encounter for screening for malignant neoplasm of respiratory organs: Secondary | ICD-10-CM

## 2023-04-07 DIAGNOSIS — Z87891 Personal history of nicotine dependence: Secondary | ICD-10-CM

## 2023-04-07 DIAGNOSIS — F17210 Nicotine dependence, cigarettes, uncomplicated: Secondary | ICD-10-CM

## 2023-06-18 DIAGNOSIS — E782 Mixed hyperlipidemia: Secondary | ICD-10-CM | POA: Diagnosis not present

## 2023-06-18 DIAGNOSIS — Z125 Encounter for screening for malignant neoplasm of prostate: Secondary | ICD-10-CM | POA: Diagnosis not present

## 2023-06-18 DIAGNOSIS — I10 Essential (primary) hypertension: Secondary | ICD-10-CM | POA: Diagnosis not present

## 2023-06-18 DIAGNOSIS — R7301 Impaired fasting glucose: Secondary | ICD-10-CM | POA: Diagnosis not present

## 2023-06-18 DIAGNOSIS — D72829 Elevated white blood cell count, unspecified: Secondary | ICD-10-CM | POA: Diagnosis not present

## 2023-06-23 DIAGNOSIS — F324 Major depressive disorder, single episode, in partial remission: Secondary | ICD-10-CM | POA: Diagnosis not present

## 2023-06-23 DIAGNOSIS — J449 Chronic obstructive pulmonary disease, unspecified: Secondary | ICD-10-CM | POA: Diagnosis not present

## 2023-06-23 DIAGNOSIS — Z125 Encounter for screening for malignant neoplasm of prostate: Secondary | ICD-10-CM | POA: Diagnosis not present

## 2023-06-23 DIAGNOSIS — Z6829 Body mass index (BMI) 29.0-29.9, adult: Secondary | ICD-10-CM | POA: Diagnosis not present

## 2023-06-23 DIAGNOSIS — J309 Allergic rhinitis, unspecified: Secondary | ICD-10-CM | POA: Diagnosis not present

## 2023-06-23 DIAGNOSIS — I10 Essential (primary) hypertension: Secondary | ICD-10-CM | POA: Diagnosis not present

## 2023-06-23 DIAGNOSIS — Z0000 Encounter for general adult medical examination without abnormal findings: Secondary | ICD-10-CM | POA: Diagnosis not present

## 2023-06-23 DIAGNOSIS — R7303 Prediabetes: Secondary | ICD-10-CM | POA: Diagnosis not present

## 2023-06-23 DIAGNOSIS — E782 Mixed hyperlipidemia: Secondary | ICD-10-CM | POA: Diagnosis not present

## 2023-06-23 DIAGNOSIS — K130 Diseases of lips: Secondary | ICD-10-CM | POA: Diagnosis not present

## 2023-06-24 DIAGNOSIS — Z9049 Acquired absence of other specified parts of digestive tract: Secondary | ICD-10-CM | POA: Diagnosis not present

## 2023-06-24 DIAGNOSIS — K50014 Crohn's disease of small intestine with abscess: Secondary | ICD-10-CM | POA: Diagnosis not present

## 2023-06-24 DIAGNOSIS — K50013 Crohn's disease of small intestine with fistula: Secondary | ICD-10-CM | POA: Diagnosis not present

## 2023-06-26 DIAGNOSIS — K50013 Crohn's disease of small intestine with fistula: Secondary | ICD-10-CM | POA: Diagnosis not present

## 2023-07-18 DIAGNOSIS — L821 Other seborrheic keratosis: Secondary | ICD-10-CM | POA: Diagnosis not present

## 2023-07-18 DIAGNOSIS — D485 Neoplasm of uncertain behavior of skin: Secondary | ICD-10-CM | POA: Diagnosis not present

## 2023-07-18 DIAGNOSIS — C4401 Basal cell carcinoma of skin of lip: Secondary | ICD-10-CM | POA: Diagnosis not present

## 2023-07-18 DIAGNOSIS — L578 Other skin changes due to chronic exposure to nonionizing radiation: Secondary | ICD-10-CM | POA: Diagnosis not present

## 2023-07-23 DIAGNOSIS — F411 Generalized anxiety disorder: Secondary | ICD-10-CM | POA: Diagnosis not present

## 2023-07-23 DIAGNOSIS — I10 Essential (primary) hypertension: Secondary | ICD-10-CM | POA: Diagnosis not present

## 2023-07-23 DIAGNOSIS — F324 Major depressive disorder, single episode, in partial remission: Secondary | ICD-10-CM | POA: Diagnosis not present

## 2023-07-23 DIAGNOSIS — J449 Chronic obstructive pulmonary disease, unspecified: Secondary | ICD-10-CM | POA: Diagnosis not present

## 2023-08-15 DIAGNOSIS — D225 Melanocytic nevi of trunk: Secondary | ICD-10-CM | POA: Diagnosis not present

## 2023-08-15 DIAGNOSIS — L821 Other seborrheic keratosis: Secondary | ICD-10-CM | POA: Diagnosis not present

## 2023-08-15 DIAGNOSIS — L578 Other skin changes due to chronic exposure to nonionizing radiation: Secondary | ICD-10-CM | POA: Diagnosis not present

## 2023-08-15 DIAGNOSIS — L280 Lichen simplex chronicus: Secondary | ICD-10-CM | POA: Diagnosis not present

## 2023-08-15 DIAGNOSIS — L570 Actinic keratosis: Secondary | ICD-10-CM | POA: Diagnosis not present

## 2023-08-15 DIAGNOSIS — Z85828 Personal history of other malignant neoplasm of skin: Secondary | ICD-10-CM | POA: Diagnosis not present

## 2023-08-15 DIAGNOSIS — L814 Other melanin hyperpigmentation: Secondary | ICD-10-CM | POA: Diagnosis not present

## 2023-08-15 DIAGNOSIS — Z08 Encounter for follow-up examination after completed treatment for malignant neoplasm: Secondary | ICD-10-CM | POA: Diagnosis not present

## 2023-08-23 DIAGNOSIS — F324 Major depressive disorder, single episode, in partial remission: Secondary | ICD-10-CM | POA: Diagnosis not present

## 2023-08-23 DIAGNOSIS — I10 Essential (primary) hypertension: Secondary | ICD-10-CM | POA: Diagnosis not present

## 2023-08-23 DIAGNOSIS — J449 Chronic obstructive pulmonary disease, unspecified: Secondary | ICD-10-CM | POA: Diagnosis not present

## 2023-08-23 DIAGNOSIS — F411 Generalized anxiety disorder: Secondary | ICD-10-CM | POA: Diagnosis not present

## 2023-08-27 DIAGNOSIS — C4401 Basal cell carcinoma of skin of lip: Secondary | ICD-10-CM | POA: Diagnosis not present

## 2023-09-22 DIAGNOSIS — J449 Chronic obstructive pulmonary disease, unspecified: Secondary | ICD-10-CM | POA: Diagnosis not present

## 2023-09-22 DIAGNOSIS — F411 Generalized anxiety disorder: Secondary | ICD-10-CM | POA: Diagnosis not present

## 2023-09-22 DIAGNOSIS — I10 Essential (primary) hypertension: Secondary | ICD-10-CM | POA: Diagnosis not present

## 2023-09-22 DIAGNOSIS — F324 Major depressive disorder, single episode, in partial remission: Secondary | ICD-10-CM | POA: Diagnosis not present

## 2023-09-25 ENCOUNTER — Inpatient Hospital Stay: Payer: Medicare Other | Attending: Hematology & Oncology

## 2023-09-25 ENCOUNTER — Inpatient Hospital Stay: Payer: Medicare Other | Admitting: Hematology & Oncology

## 2023-09-25 ENCOUNTER — Encounter: Payer: Self-pay | Admitting: Hematology & Oncology

## 2023-09-25 VITALS — BP 123/51 | HR 49 | Temp 97.7°F | Resp 20 | Ht 73.0 in | Wt 217.1 lb

## 2023-09-25 DIAGNOSIS — D72823 Leukemoid reaction: Secondary | ICD-10-CM | POA: Diagnosis not present

## 2023-09-25 DIAGNOSIS — F17200 Nicotine dependence, unspecified, uncomplicated: Secondary | ICD-10-CM | POA: Diagnosis not present

## 2023-09-25 DIAGNOSIS — D72829 Elevated white blood cell count, unspecified: Secondary | ICD-10-CM | POA: Insufficient documentation

## 2023-09-25 DIAGNOSIS — D72820 Lymphocytosis (symptomatic): Secondary | ICD-10-CM

## 2023-09-25 DIAGNOSIS — Z79899 Other long term (current) drug therapy: Secondary | ICD-10-CM | POA: Diagnosis not present

## 2023-09-25 LAB — CBC WITH DIFFERENTIAL (CANCER CENTER ONLY)
Abs Immature Granulocytes: 0.05 10*3/uL (ref 0.00–0.07)
Basophils Absolute: 0.1 10*3/uL (ref 0.0–0.1)
Basophils Relative: 1 %
Eosinophils Absolute: 0.4 10*3/uL (ref 0.0–0.5)
Eosinophils Relative: 4 %
HCT: 47.3 % (ref 39.0–52.0)
Hemoglobin: 16.2 g/dL (ref 13.0–17.0)
Immature Granulocytes: 1 %
Lymphocytes Relative: 20 %
Lymphs Abs: 2.1 10*3/uL (ref 0.7–4.0)
MCH: 32.1 pg (ref 26.0–34.0)
MCHC: 34.2 g/dL (ref 30.0–36.0)
MCV: 93.8 fL (ref 80.0–100.0)
Monocytes Absolute: 0.9 10*3/uL (ref 0.1–1.0)
Monocytes Relative: 8 %
Neutro Abs: 7.3 10*3/uL (ref 1.7–7.7)
Neutrophils Relative %: 66 %
Platelet Count: 235 10*3/uL (ref 150–400)
RBC: 5.04 MIL/uL (ref 4.22–5.81)
RDW: 12.8 % (ref 11.5–15.5)
WBC Count: 10.9 10*3/uL — ABNORMAL HIGH (ref 4.0–10.5)
nRBC: 0 % (ref 0.0–0.2)

## 2023-09-25 LAB — LACTATE DEHYDROGENASE: LDH: 141 U/L (ref 98–192)

## 2023-09-25 LAB — CMP (CANCER CENTER ONLY)
ALT: 28 U/L (ref 0–44)
AST: 24 U/L (ref 15–41)
Albumin: 4.3 g/dL (ref 3.5–5.0)
Alkaline Phosphatase: 58 U/L (ref 38–126)
Anion gap: 7 (ref 5–15)
BUN: 22 mg/dL (ref 8–23)
CO2: 27 mmol/L (ref 22–32)
Calcium: 9.5 mg/dL (ref 8.9–10.3)
Chloride: 106 mmol/L (ref 98–111)
Creatinine: 1.16 mg/dL (ref 0.61–1.24)
GFR, Estimated: >60 mL/min (ref >60)
Glucose, Bld: 108 mg/dL — ABNORMAL HIGH (ref 70–99)
Potassium: 5 mmol/L (ref 3.5–5.1)
Sodium: 140 mmol/L (ref 135–145)
Total Bilirubin: 0.5 mg/dL (ref 0.0–1.2)
Total Protein: 7 g/dL (ref 6.5–8.1)

## 2023-09-25 LAB — SAVE SMEAR(SSMR), FOR PROVIDER SLIDE REVIEW

## 2023-09-25 NOTE — Progress Notes (Signed)
 Patient is in BP clinical trial, may be taking Lepodisiran sodium.

## 2023-09-25 NOTE — Progress Notes (Signed)
 Hematology and Oncology Follow Up Visit  Zachary Oneal [id number] 14-Oct-1950 73 y.o. 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 is back for follow-up.  We see him every year.  The big news is that he had colon surgery back in November.  He had an ileocolectomy.  He was in the hospital for 4 days.  He really looks good.  He had no complications from this.  This is all from the Crohn's disease.    Overall, he has had no problems with cough or shortness of breath.  He has had no problem with COVID.  He has had no rashes.  There has been no leg swelling.  He has had no obvious change in bowel or bladder habits.  He is still smoking.  Again, he is trying to cut back.  He has had no bleeding.  Currently, I would say that his performance status is probably ECOG 1.     Medications:  Current Outpatient Medications:    amLODipine  (NORVASC ) 10 MG tablet, TAKE 1 TABLET DAILY, Disp: 90 tablet, Rfl: 2   aspirin  81 MG chewable tablet, Chew 81 mg by mouth daily., Disp: , Rfl:    B Complex Vitamins (VITAMIN B COMPLEX) CAPS, Take 1 capsule by mouth daily., Disp: , Rfl:    bisoprolol  (ZEBETA ) 5 MG tablet, TAKE 1 TABLET BY MOUTH EVERY DAY, Disp: 90 tablet, Rfl: 0   Cholecalciferol  (VITAMIN D3) 50 MCG (2000 UT) TABS, Take 2,000 Units by mouth daily., Disp: , Rfl:    citalopram  (CELEXA ) 20 MG tablet, Take 20 mg by mouth daily., Disp: , Rfl:    fluticasone  (FLONASE ) 50 MCG/ACT nasal spray, Place 2 sprays into both nostrils daily as needed for allergies., Disp: , Rfl:    HUMIRA, 2 PEN, 40 MG/0.4ML pen, SMARTSIG:40 Milligram(s) SUB-Q Every 2 Weeks, Disp: , Rfl:    hydrOXYzine  (ATARAX /VISTARIL ) 10 MG tablet, Take 10 mg by mouth daily as needed for anxiety., Disp: , Rfl:    losartan  (COZAAR ) 25 MG tablet, TAKE 1 TABLET EVERY  EVENING, Disp: 90 tablet, Rfl: 2   mometasone -formoterol  (DULERA ) 100-5 MCG/ACT AERO, Inhale 2 puffs into the lungs 2 (two) times daily as needed for shortness of breath., Disp: , Rfl:    Multiple Vitamins-Minerals (MULTIVITAMIN ADULTS 50+ PO), Take 2 tablets by mouth daily., Disp: , Rfl:    rosuvastatin  (CRESTOR ) 20 MG tablet, Take 20 mg by mouth daily., Disp: , Rfl:   Allergies:  Allergies  Allergen Reactions   Nsaids Other (See Comments)    Crohn's = avoid NSAIDs   Hydrocodone Itching   Phenazopyridine  Hcl Itching    Past Medical History, Surgical history, Social history, and Family History were reviewed and updated.  Review of Systems: Review of Systems  All other systems reviewed and are negative.  Physical Exam:  height is 6' 1 (1.854 m) and weight is 217 lb 1.9 oz (98.5 kg). His oral temperature is 97.7 F (36.5 C). His blood pressure is 123/51 (abnormal) and his pulse is 49 (abnormal). His respiration is 20 and oxygen saturation is 95%.   Wt Readings from Last 3 Encounters:  09/25/23 217 lb 1.9 oz (98.5 kg)  02/02/23 183 lb 6.8 oz (83.2 kg)  01/21/23 200 lb (90.7 kg)     Physical Exam Vitals reviewed.  HENT:  Head: Normocephalic and atraumatic.  Eyes:     Pupils: Pupils are equal, round, and reactive to light.  Cardiovascular:     Rate and Rhythm: Normal rate and regular rhythm.     Heart sounds: Normal heart sounds.  Pulmonary:     Effort: Pulmonary effort is normal.     Breath sounds: Normal breath sounds.  Abdominal:     General: Bowel sounds are normal.     Palpations: Abdomen is soft.  Musculoskeletal:        General: No tenderness or deformity. Normal range of motion.     Cervical back: Normal range of motion.  Lymphadenopathy:     Cervical: No cervical adenopathy.  Skin:    General: Skin is warm and dry.     Findings: No erythema or rash.  Neurological:     Mental Status: He is alert and oriented to person, place, and time.  Psychiatric:         Behavior: Behavior normal.        Thought Content: Thought content normal.        Judgment: Judgment normal.      Lab Results  Component Value Date   WBC 10.9 (H) 09/25/2023   HGB 16.2 09/25/2023   HCT 47.3 09/25/2023   MCV 93.8 09/25/2023   PLT 235 09/25/2023     Chemistry      Component Value Date/Time   NA 140 09/25/2023 1007   K 5.0 09/25/2023 1007   CL 106 09/25/2023 1007   CO2 27 09/25/2023 1007   BUN 22 09/25/2023 1007   CREATININE 1.16 09/25/2023 1007      Component Value Date/Time   CALCIUM  9.5 09/25/2023 1007   ALKPHOS 58 09/25/2023 1007   AST 24 09/25/2023 1007   ALT 28 09/25/2023 1007   BILITOT 0.5 09/25/2023 1007      Impression and Plan: [name] is a 73 year old male. He has chronic leukocytosis.  I just wonder if some of this leukocytosis was from the Crohn's disease.  After his surgery, his white cell count is coming down quite nicely.  I looked at his blood smear.  I do not see anything on the blood smear that looked significant.  As always, we will still plan to get him back in 1 year.   Maude JONELLE Crease, MD 7/3/202511:38 AM

## 2023-10-07 DIAGNOSIS — K50013 Crohn's disease of small intestine with fistula: Secondary | ICD-10-CM | POA: Diagnosis not present

## 2023-10-07 DIAGNOSIS — Z9049 Acquired absence of other specified parts of digestive tract: Secondary | ICD-10-CM | POA: Diagnosis not present

## 2023-10-07 DIAGNOSIS — K50014 Crohn's disease of small intestine with abscess: Secondary | ICD-10-CM | POA: Diagnosis not present

## 2023-11-10 IMAGING — CT CT CHEST LCS NODULE FOLLOW-UP W/O CM
1 series · 15 of 32 positions shown, 19 images · non-contrast
Comparison: 08/02/2020

CLINICAL DATA: 70-year-old male with 47 pack-year history of
smoking. Lung cancer screening.

EXAM:
CT CHEST WITHOUT CONTRAST FOR LUNG CANCER SCREENING NODULE FOLLOW-UP
TECHNIQUE: Multidetector CT imaging of the chest was performed following the
standard protocol without IV contrast.

[Series 2: ldct screening <30 bmi · axial · 0.78mm/px · z∈[-381,-71]mm · 15 of 71 slices shown, 19 images]
[im 6/71  mediastinal]
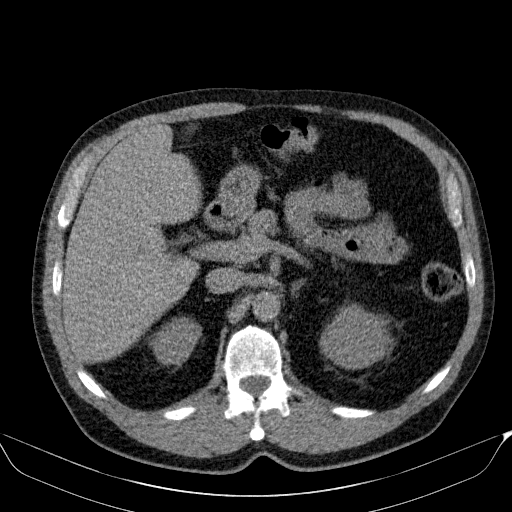
[im 6/71  lung]
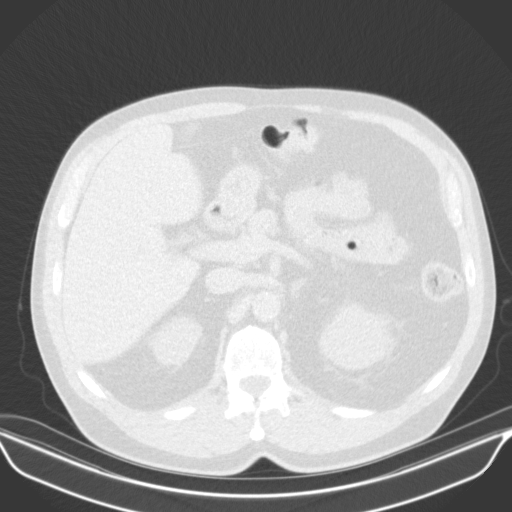
[im 11/71  lung]
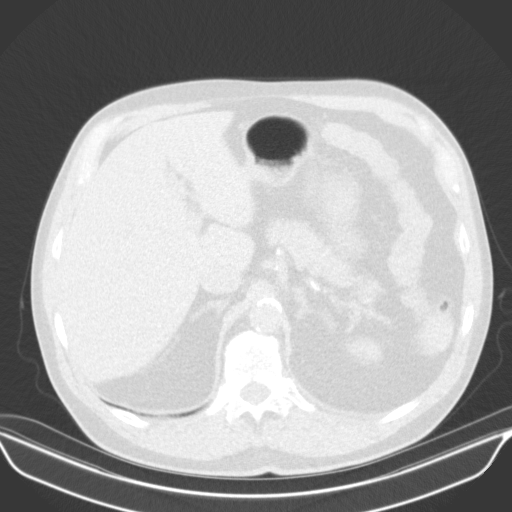
[im 15/71  lung]
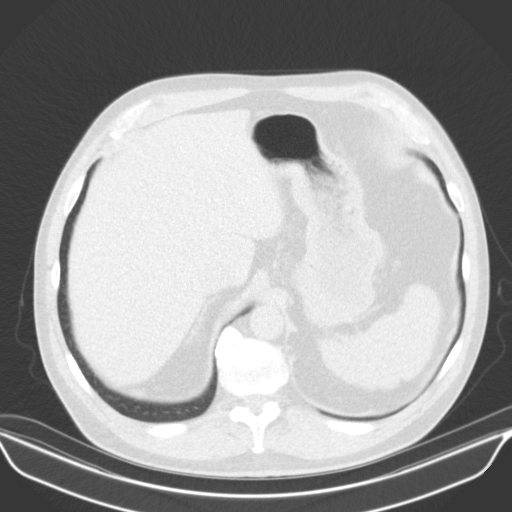
[im 19/71  lung]
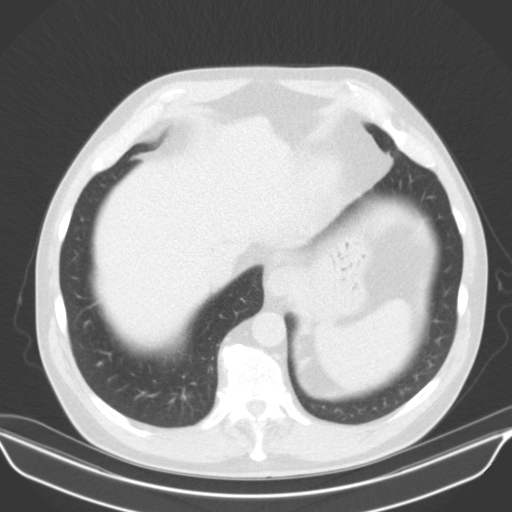
[im 24/71  mediastinal]
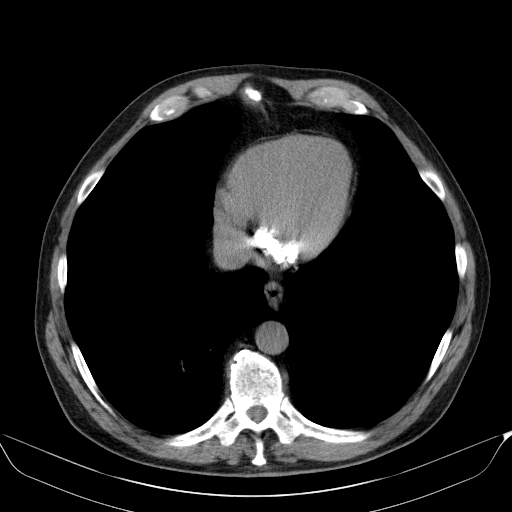
[im 24/71  lung]
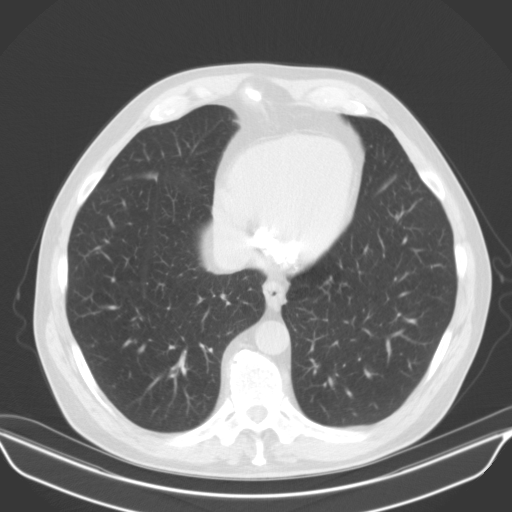
[im 29/71  lung]
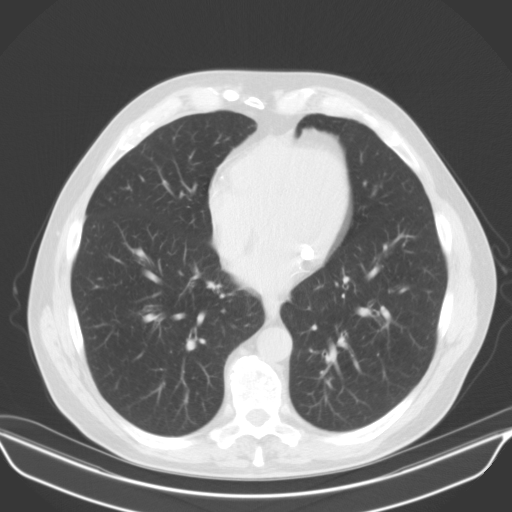
[im 34/71  lung]
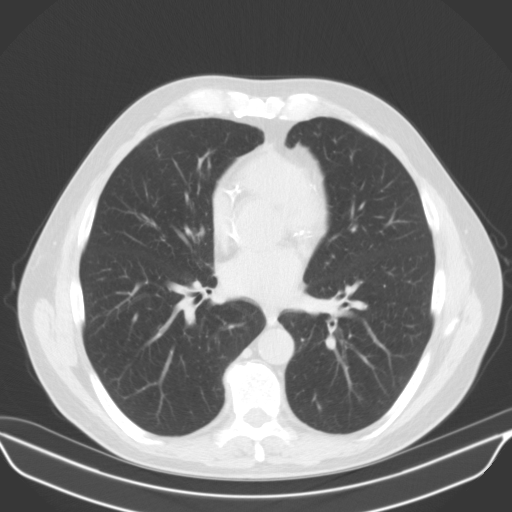
[im 38/71  lung]
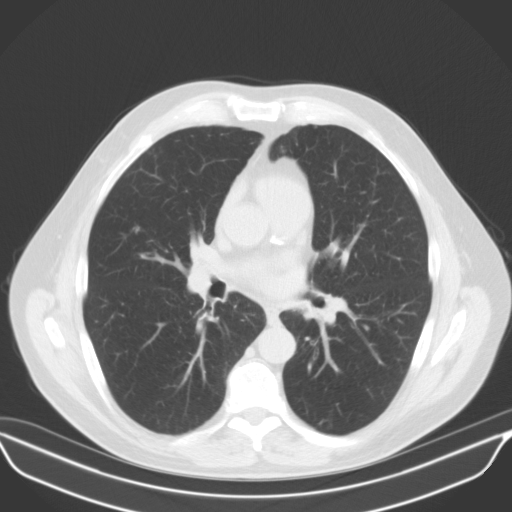
[im 42/71  mediastinal]
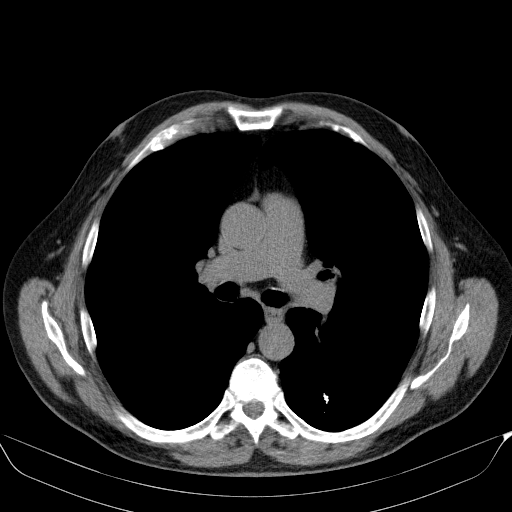
[im 42/71  lung]
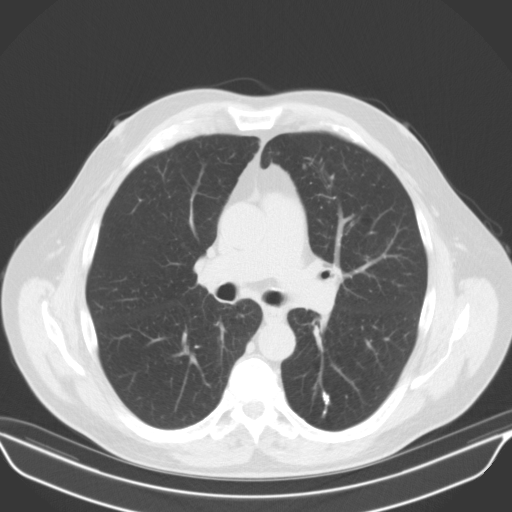
[im 45/71  lung]
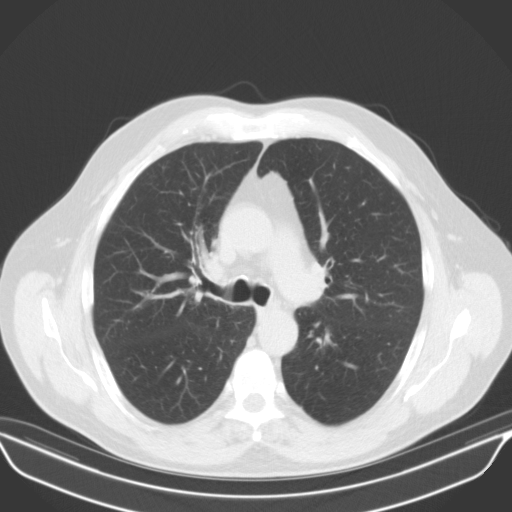
[im 50/71  lung]
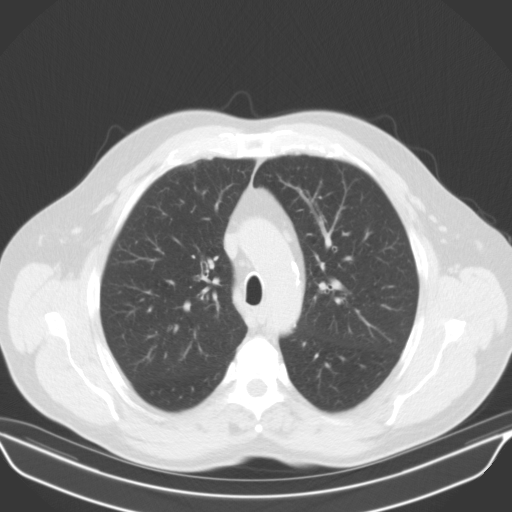
[im 55/71  lung]
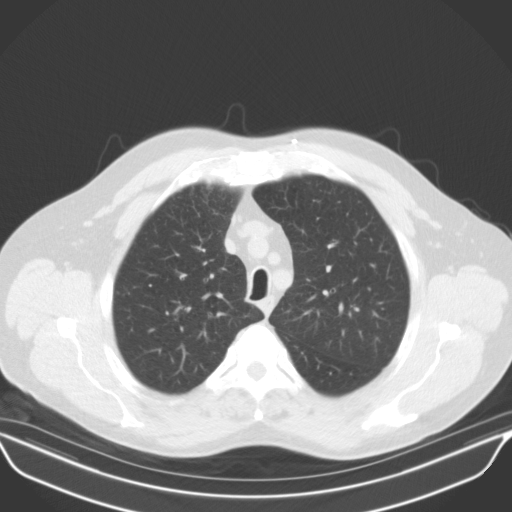
[im 58/71  mediastinal]
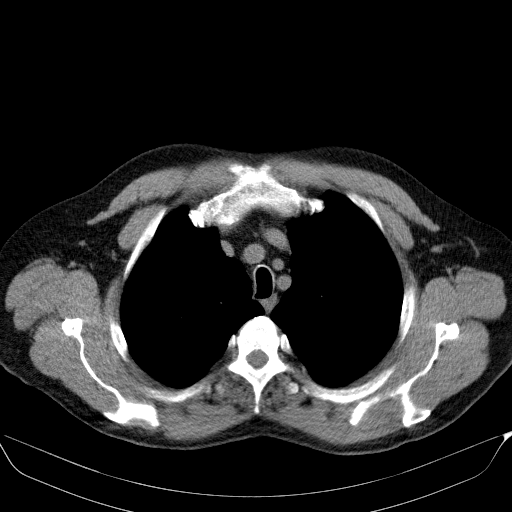
[im 58/71  lung]
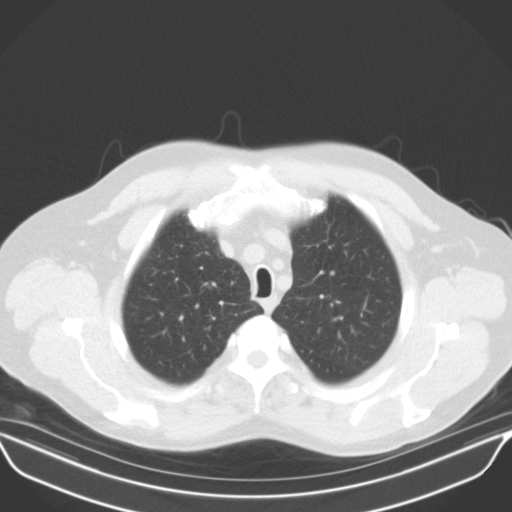
[im 63/71  lung]
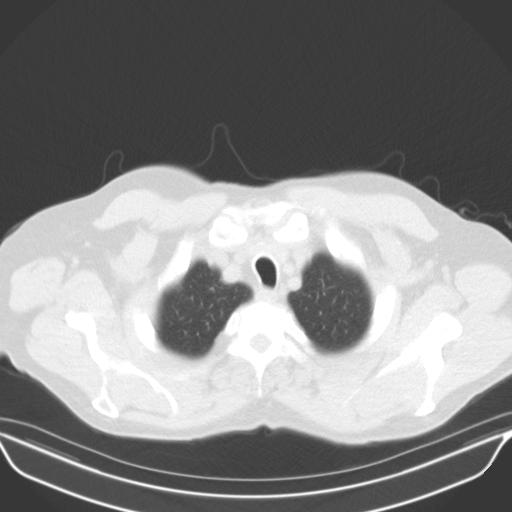
[im 68/71  lung]
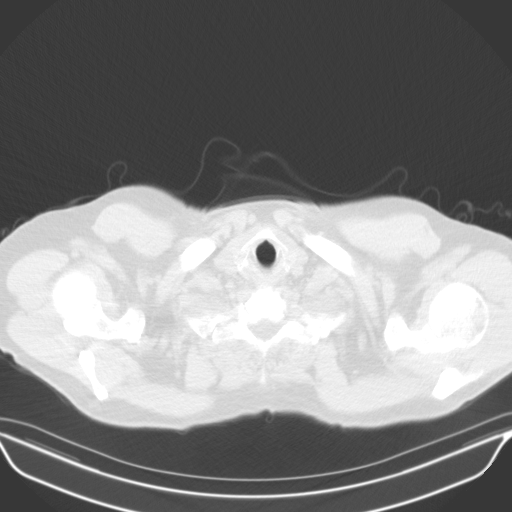

[15 of 32 positions shown; findings below may reference images not displayed]

FINDINGS: Cardiovascular: The heart size is normal. No substantial pericardial
effusion. Coronary artery calcification is evident. Mitral annular
calcification noted. Mild atherosclerotic calcification is noted in
the wall of the thoracic aorta.

Mediastinum/Nodes: No mediastinal lymphadenopathy. No evidence for
gross hilar lymphadenopathy although assessment is limited by the
lack of intravenous contrast on the current study. The esophagus has
normal imaging features. There is no axillary lymphadenopathy.

Lungs/Pleura: Centrilobular emphsyema noted. Tiny pulmonary nodules
identified previously are stable in the interval. No new suspicious
pulmonary nodule or mass. No focal airspace consolidation. No
pleural effusion.

Upper Abdomen: Unremarkable.

Musculoskeletal: No worrisome lytic or sclerotic osseous
abnormality.
IMPRESSION: 1. Lung-RADS 2, benign appearance or behavior. Continue annual
screening with low-dose chest CT without contrast in 12 months.
2.  Emphysema ([id number]-EZJ.8) and Aortic Atherosclerosis (VQA4Z-170.0)

## 2023-11-23 DIAGNOSIS — F411 Generalized anxiety disorder: Secondary | ICD-10-CM | POA: Diagnosis not present

## 2023-11-23 DIAGNOSIS — F324 Major depressive disorder, single episode, in partial remission: Secondary | ICD-10-CM | POA: Diagnosis not present

## 2023-11-23 DIAGNOSIS — J449 Chronic obstructive pulmonary disease, unspecified: Secondary | ICD-10-CM | POA: Diagnosis not present

## 2023-11-23 DIAGNOSIS — I10 Essential (primary) hypertension: Secondary | ICD-10-CM | POA: Diagnosis not present

## 2023-12-23 DIAGNOSIS — J449 Chronic obstructive pulmonary disease, unspecified: Secondary | ICD-10-CM | POA: Diagnosis not present

## 2023-12-23 DIAGNOSIS — F324 Major depressive disorder, single episode, in partial remission: Secondary | ICD-10-CM | POA: Diagnosis not present

## 2023-12-23 DIAGNOSIS — I10 Essential (primary) hypertension: Secondary | ICD-10-CM | POA: Diagnosis not present

## 2023-12-23 DIAGNOSIS — F411 Generalized anxiety disorder: Secondary | ICD-10-CM | POA: Diagnosis not present

## 2024-01-01 DIAGNOSIS — Z23 Encounter for immunization: Secondary | ICD-10-CM | POA: Diagnosis not present

## 2024-01-23 DIAGNOSIS — F324 Major depressive disorder, single episode, in partial remission: Secondary | ICD-10-CM | POA: Diagnosis not present

## 2024-01-23 DIAGNOSIS — F411 Generalized anxiety disorder: Secondary | ICD-10-CM | POA: Diagnosis not present

## 2024-01-23 DIAGNOSIS — J449 Chronic obstructive pulmonary disease, unspecified: Secondary | ICD-10-CM | POA: Diagnosis not present

## 2024-01-23 DIAGNOSIS — I10 Essential (primary) hypertension: Secondary | ICD-10-CM | POA: Diagnosis not present

## 2024-02-17 DIAGNOSIS — Z08 Encounter for follow-up examination after completed treatment for malignant neoplasm: Secondary | ICD-10-CM | POA: Diagnosis not present

## 2024-02-17 DIAGNOSIS — L814 Other melanin hyperpigmentation: Secondary | ICD-10-CM | POA: Diagnosis not present

## 2024-02-17 DIAGNOSIS — L280 Lichen simplex chronicus: Secondary | ICD-10-CM | POA: Diagnosis not present

## 2024-02-17 DIAGNOSIS — L578 Other skin changes due to chronic exposure to nonionizing radiation: Secondary | ICD-10-CM | POA: Diagnosis not present

## 2024-02-17 DIAGNOSIS — L821 Other seborrheic keratosis: Secondary | ICD-10-CM | POA: Diagnosis not present

## 2024-02-17 DIAGNOSIS — D1801 Hemangioma of skin and subcutaneous tissue: Secondary | ICD-10-CM | POA: Diagnosis not present

## 2024-02-17 DIAGNOSIS — Z85828 Personal history of other malignant neoplasm of skin: Secondary | ICD-10-CM | POA: Diagnosis not present

## 2024-02-17 DIAGNOSIS — L570 Actinic keratosis: Secondary | ICD-10-CM | POA: Diagnosis not present

## 2024-02-22 DIAGNOSIS — F324 Major depressive disorder, single episode, in partial remission: Secondary | ICD-10-CM | POA: Diagnosis not present

## 2024-02-22 DIAGNOSIS — J449 Chronic obstructive pulmonary disease, unspecified: Secondary | ICD-10-CM | POA: Diagnosis not present

## 2024-02-22 DIAGNOSIS — I10 Essential (primary) hypertension: Secondary | ICD-10-CM | POA: Diagnosis not present

## 2024-02-22 DIAGNOSIS — F411 Generalized anxiety disorder: Secondary | ICD-10-CM | POA: Diagnosis not present

## 2024-03-24 ENCOUNTER — Other Ambulatory Visit

## 2024-04-06 ENCOUNTER — Ambulatory Visit
Admission: RE | Admit: 2024-04-06 | Discharge: 2024-04-06 | Disposition: A | Source: Ambulatory Visit | Attending: Acute Care | Admitting: Acute Care

## 2024-04-06 DIAGNOSIS — Z122 Encounter for screening for malignant neoplasm of respiratory organs: Secondary | ICD-10-CM

## 2024-04-06 DIAGNOSIS — Z87891 Personal history of nicotine dependence: Secondary | ICD-10-CM

## 2024-04-06 DIAGNOSIS — F17210 Nicotine dependence, cigarettes, uncomplicated: Secondary | ICD-10-CM

## 2024-04-16 ENCOUNTER — Other Ambulatory Visit: Payer: Self-pay

## 2024-04-16 DIAGNOSIS — F17210 Nicotine dependence, cigarettes, uncomplicated: Secondary | ICD-10-CM

## 2024-04-16 DIAGNOSIS — Z122 Encounter for screening for malignant neoplasm of respiratory organs: Secondary | ICD-10-CM

## 2024-04-16 DIAGNOSIS — Z87891 Personal history of nicotine dependence: Secondary | ICD-10-CM

## 2024-09-23 ENCOUNTER — Ambulatory Visit: Admitting: Hematology & Oncology

## 2024-09-23 ENCOUNTER — Inpatient Hospital Stay
